# Patient Record
Sex: Male | Born: 1980 | Race: Black or African American | Hispanic: No | Marital: Married | Smoking: Former smoker
Health system: Southern US, Community
[De-identification: ages and names within clinical notes are randomized; demographics above are authoritative.]

## PROBLEM LIST (undated history)

## (undated) DIAGNOSIS — Q232 Congenital mitral stenosis: Secondary | ICD-10-CM

## (undated) DIAGNOSIS — D649 Anemia, unspecified: Secondary | ICD-10-CM

## (undated) DIAGNOSIS — I10 Essential (primary) hypertension: Secondary | ICD-10-CM

## (undated) DIAGNOSIS — G709 Myoneural disorder, unspecified: Secondary | ICD-10-CM

## (undated) DIAGNOSIS — T7840XA Allergy, unspecified, initial encounter: Secondary | ICD-10-CM

## (undated) DIAGNOSIS — R569 Unspecified convulsions: Secondary | ICD-10-CM

## (undated) DIAGNOSIS — G35 Multiple sclerosis: Secondary | ICD-10-CM

## (undated) HISTORY — DX: Congenital mitral stenosis: Q23.2

## (undated) HISTORY — DX: Allergy, unspecified, initial encounter: T78.40XA

## (undated) HISTORY — DX: Myoneural disorder, unspecified: G70.9

## (undated) HISTORY — DX: Essential (primary) hypertension: I10

## (undated) HISTORY — PX: FRACTURE SURGERY: SHX138

## (undated) HISTORY — DX: Unspecified convulsions: R56.9

## (undated) HISTORY — DX: Multiple sclerosis: G35

## (undated) HISTORY — DX: Anemia, unspecified: D64.9

---

## 2003-07-04 ENCOUNTER — Encounter: Admission: RE | Admit: 2003-07-04 | Discharge: 2003-07-04 | Payer: Self-pay | Admitting: Occupational Medicine

## 2012-12-19 ENCOUNTER — Ambulatory Visit (INDEPENDENT_AMBULATORY_CARE_PROVIDER_SITE_OTHER): Payer: BC Managed Care – PPO | Admitting: Family Medicine

## 2012-12-19 VITALS — BP 118/70 | HR 46 | Temp 98.3°F | Resp 16 | Ht 73.25 in | Wt 223.8 lb

## 2012-12-19 DIAGNOSIS — R21 Rash and other nonspecific skin eruption: Secondary | ICD-10-CM

## 2012-12-19 MED ORDER — CETIRIZINE HCL 10 MG PO TABS: 10.0000 mg | ORAL_TABLET | Freq: Every day | ORAL | Status: DC

## 2012-12-19 MED ORDER — TRIAMCINOLONE ACETONIDE 0.1 % EX CREA: TOPICAL_CREAM | Freq: Two times a day (BID) | CUTANEOUS | Status: DC

## 2012-12-19 MED ORDER — RANITIDINE HCL 150 MG PO TABS: 150.0000 mg | ORAL_TABLET | Freq: Two times a day (BID) | ORAL | Status: DC

## 2012-12-19 MED ORDER — HYDROXYZINE HCL 25 MG PO TABS: 12.5000 mg | ORAL_TABLET | Freq: Three times a day (TID) | ORAL | Status: DC | PRN

## 2012-12-19 NOTE — Progress Notes (Signed)
  Subjective:    Patient ID: Dylan Mccoy, male    DOB: [birth year], 32 y.o.   MRN: [id number] Chief Complaint  Patient presents with  . Urticaria    x 3 weeks, worsening even with Benedryl and Allegra use.    HPI  Dylan Mccoy is a pleasant 32 yo male who developed a lot of very itchy bumps around his axilla (but not in the actual axilla/hair area). Has had similar rashes in past but always goes away on its own within a few d.  Now spreading down arms to hands and up onto neck.  Very itchy.  Spreading and none fading going away at all, constant.  Trying allegra and benadryl as well as topical benadryl w/ only temporary relief.  No known allergies or triggers.  No change in products - detergent, deodorant, soap, etc.  No contacts w/ similar rash. No animals.  Has an appt sched w/ derm but not for 2 more weeks and itching so bad he couldn't wait that long.  History reviewed. No pertinent past medical history. No current outpatient prescriptions on file prior to visit.   No current facility-administered medications on file prior to visit.   No Known Allergies  Review of Systems  Constitutional: Negative for fever, chills, diaphoresis, activity change, appetite change, fatigue and unexpected weight change.  HENT: Negative for congestion, sore throat, rhinorrhea and sneezing.   Respiratory: Negative for cough.   Cardiovascular: Negative for leg swelling.  Gastrointestinal: Negative for nausea, vomiting, abdominal pain, diarrhea and constipation.  Musculoskeletal: Negative for myalgias, joint swelling and gait problem.  Skin: Positive for rash. Negative for color change, pallor and wound.  Neurological: Negative for weakness and numbness.  Hematological: Negative for adenopathy. Does not bruise/bleed easily.      BP 118/70  Pulse 46  Temp(Src) 98.3 F (36.8 C) (Oral)  Resp 16  Ht 6' 1.25" (1.861 m)  Wt 223 lb 12.8 oz (101.515 kg)  BMI 29.31 kg/m2  SpO2 99% Objective:   Physical  Exam  Constitutional: He is oriented to person, place, and time. He appears well-developed and well-nourished. No distress.  HENT:  Head: Normocephalic and atraumatic.  Eyes: No scleral icterus.  Pulmonary/Chest: Effort normal.  Neurological: He is alert and oriented to person, place, and time.  Skin: Skin is warm and dry. Rash noted. Rash is papular. He is not diaphoretic.  Small skin-colored pinpoint papules - clustered together till almost confluent along posterior and anterior aspects of axilla (none where hair or deodorant is) and extending down arms to hands and fingers, no lesions on palms or between fingers. Most papules excoriated, no sign of any vesicles or pustules. Papules decrease dramatically in freq as they descend till just 1 or 2 on each volar surface of fingers.  None over trunk, face, neck, or legs.  Psychiatric: He has a normal mood and affect. His behavior is normal.      Assessment & Plan:  Rash - unknown etiology -  ddx includes dermatitis herpetiformis, contact dermatitis, lichen planus.  Try topical steroid, cetirizine w/ prn atarax, ranitidine. Refer to derm. If he does not get sig relief w/ treatment, return for further eval and possible biopsy.

## 2013-03-19 DIAGNOSIS — L509 Urticaria, unspecified: Secondary | ICD-10-CM | POA: Insufficient documentation

## 2016-11-29 ENCOUNTER — Ambulatory Visit: Payer: Self-pay | Admitting: Allergy and Immunology

## 2016-12-26 ENCOUNTER — Encounter: Payer: Self-pay | Admitting: Allergy and Immunology

## 2016-12-26 ENCOUNTER — Ambulatory Visit (INDEPENDENT_AMBULATORY_CARE_PROVIDER_SITE_OTHER): Payer: BLUE CROSS/BLUE SHIELD | Admitting: Allergy and Immunology

## 2016-12-26 DIAGNOSIS — L309 Dermatitis, unspecified: Secondary | ICD-10-CM

## 2016-12-26 MED ORDER — CLOBETASOL PROPIONATE 0.05 % EX FOAM
Freq: Two times a day (BID) | CUTANEOUS | 0 refills | Status: DC
Start: 2016-12-26 — End: 2017-06-19

## 2016-12-26 MED ORDER — LEVOCETIRIZINE DIHYDROCHLORIDE 5 MG PO TABS: 5.0000 mg | ORAL_TABLET | Freq: Every evening | ORAL | 5 refills | Status: DC

## 2016-12-26 NOTE — Progress Notes (Signed)
New Patient Note  RE: Dylan Mccoy MRN: [id number] DOB: May 28, 1981 Date of Office Visit: 12/26/2016  Referring provider: No ref. provider found Primary care provider: No PCP Per Patient  Chief Complaint: Rash   History of present illness: Dylan Mccoy is a 36 y.o. male presenting today for evaluation of dermatitis.  He reports that over the past 3 or 4 years he has experienced dermatitis, primarily on his hands, but also on his chest, axillae, and neck.  The rash is comprised of small vesicles which are mildly pruritic.  He was evaluated by another allergist previously with patch testing which revealed positive reactions and nickel as well as a few other substances which he cannot recall. He states that at the time he had attempted to eliminate these substances from his environment, however was unable to do this completely as he works in a [employer].  He has experienced some symptom relief with cetirizine, however the rash persists despite taking this medication because worse when he stops taking this medication.  Triamcinolone ointment has provided modest relief.  He has no symptoms consistent with allergic rhinitis, allergic conjunctivitis, or asthma.  He has never been evaluated by dermatologist or had a skin biopsy, however he is scheduled for dermatology consultation on Jan 11, 2017.   Assessment and plan: Dermatitis  Physical examination and history of positive patch testing support allergic contact dermatitis.  A prescription has been provided for levocetirizine, 5mg  daily as needed.  A prescription has been provided for clobetasol 0.05% foam sparingly to affected areas twice daily as needed.  This medication is not to be used on the face, neck, axillae, or groin.  We will retrieve patch test results from his previous allergist.  If he is unable to successfully avoid environmental substances contributing to the dermatitis, he may try barrier cream (ie, Gloves in a  Bottle or Liquid Gloves).  Keep appointment with dermatologist as a biopsy may be beneficial in confirming the diagnosis.   Meds ordered this encounter  Medications  . levocetirizine (XYZAL) 5 MG tablet    Sig: Take 1 tablet (5 mg total) by mouth every evening.    Dispense:  30 tablet    Refill:  5  . clobetasol (OLUX) 0.05 % topical foam    Sig: Apply topically 2 (two) times daily.    Dispense:  50 g    Refill:  0    Diagnostics: None. We will retrieve previous patch test results.    Physical examination: Blood pressure 110/70, pulse 60, temperature 97.6 F (36.4 C), temperature source Oral, resp. rate 16, height 6\' 2"  (1.88 m), weight 239 lb 9.6 oz (108.7 kg), SpO2 97 %.  General: Alert, interactive, in no acute distress. Neck: Supple without lymphadenopathy. Lungs: Clear to auscultation without wheezing, rhonchi or rales. CV: Normal S1, S2 without murmurs. Abdomen: Nondistended, nontender. Skin: Scattered and clustered vesicles on the hands and fingers, particularly between her fingers. Extremities:  No clubbing, cyanosis or edema. Neuro:   Grossly intact.  Review of systems:  Review of systems negative except as noted in HPI / PMHx or noted below: Review of Systems  Constitutional: Negative.   HENT: Negative.   Eyes: Negative.   Respiratory: Negative.   Cardiovascular: Negative.   Gastrointestinal: Negative.   Genitourinary: Negative.   Musculoskeletal: Negative.   Skin: Negative.   Neurological: Negative.   Endo/Heme/Allergies: Negative.   Psychiatric/Behavioral: Negative.     Past medical history:  Dermatitis.  Past surgical history:  Past Surgical History:  Procedure Laterality Date  . FRACTURE SURGERY     R elbow, 2nd grade    Family history: Family History  Problem Relation Age of Onset  . Asthma Mother   . Allergies Mother   . Urticaria Mother   . Asthma Brother   . Allergies Brother   . Diabetes Maternal Grandmother   . Diabetes  Paternal Grandmother   . Allergic rhinitis Neg Hx   . Angioedema Neg Hx   . Atopy Neg Hx   . Eczema Neg Hx   . Immunodeficiency Neg Hx     Social history: Social History   Social History  . Marital status: Single    Spouse name: N/A  . Number of children: N/A  . Years of education: N/A   Occupational History  . Not on file.   Social History Main Topics  . Smoking status: Former Games developer  . Smokeless tobacco: Never Used  . Alcohol use No  . Drug use: No  . Sexual activity: Yes    Birth control/ protection: Pill     Comment: 1 partner last year   Other Topics Concern  . Not on file   Social History Narrative  . No narrative on file   Environmental History: The patient lives in a 36 year old townhouse with carpeting throughout, gas heat, and central air.  There is a dog in the townhouse.  There is no known mold/water damage in the townhome.  He is a nonsmoker.  Allergies as of 12/26/2016   No Known Allergies     Medication List       Accurate as of 12/26/16  5:15 PM. Always use your most recent med list.          cetirizine 10 MG tablet Commonly known as:  ZYRTEC Take 1 tablet (10 mg total) by mouth daily.   clobetasol 0.05 % topical foam Commonly known as:  OLUX Apply topically 2 (two) times daily.   hydrOXYzine 25 MG tablet Commonly known as:  ATARAX/VISTARIL Take 0.5-1 tablets (12.5-25 mg total) by mouth every 8 (eight) hours as needed for itching.   levocetirizine 5 MG tablet Commonly known as:  XYZAL Take 1 tablet (5 mg total) by mouth every evening.   ranitidine 150 MG tablet Commonly known as:  ZANTAC Take 1 tablet (150 mg total) by mouth 2 (two) times daily.   triamcinolone cream 0.1 % Commonly known as:  KENALOG Apply topically 2 (two) times daily.       Known medication allergies: No Known Allergies  I appreciate the opportunity to take part in Stephone's care. Please do not hesitate to contact me with questions.  Sincerely,   R.  Jorene Guest, MD

## 2016-12-26 NOTE — Assessment & Plan Note (Addendum)
   Physical examination and history of positive patch testing support allergic contact dermatitis.  A prescription has been provided for levocetirizine, 5mg  daily as needed.  A prescription has been provided for clobetasol 0.05% foam sparingly to affected areas twice daily as needed.  This medication is not to be used on the face, neck, axillae, or groin.  We will retrieve patch test results from his previous allergist.  If he is unable to successfully avoid environmental substances contributing to the dermatitis, he may try barrier cream (ie, Gloves in a Bottle or Liquid Gloves).  Keep appointment with dermatologist as a biopsy may be beneficial in confirming the diagnosis.

## 2016-12-26 NOTE — Patient Instructions (Addendum)
Dermatitis  Physical examination and history of positive patch testing support allergic contact dermatitis.  A prescription has been provided for levocetirizine, 5mg  daily as needed.  A prescription has been provided for clobetasol 0.05% foam sparingly to affected areas twice daily as needed.  This medication is not to be used on the face, neck, axillae, or groin.  We will retrieve patch test results from his previous allergist.  If he is unable to successfully avoid environmental substances contributing to the dermatitis, he may try barrier cream (ie, Gloves in a Bottle or Liquid Gloves).  Keep appointment with dermatologist as a biopsy may be beneficial in confirming the diagnosis.   Return if symptoms worsen or fail to improve.

## 2016-12-27 ENCOUNTER — Telehealth: Payer: Self-pay | Admitting: *Deleted

## 2016-12-27 NOTE — Telephone Encounter (Signed)
-----   Message from Cristal Ford, MD sent at 12/26/2016  5:13 PM EDT ----- Please inform the patient that his insurance does not cover mometasone ointment I planned on prescribing, therefore I have prescribed clobetasol 0.05% foam.  This is actually a better medication and I am surprised that his insurance covers it.  He may use it one or 2 times daily as needed.  This medication is not to be used on the face, neck, axillae, or groin.

## 2016-12-27 NOTE — Telephone Encounter (Signed)
Left message to return call 

## 2016-12-28 NOTE — Telephone Encounter (Signed)
Spoke to patient advised as written below verbalized undestanding

## 2017-02-15 ENCOUNTER — Emergency Department (HOSPITAL_COMMUNITY): Payer: BLUE CROSS/BLUE SHIELD

## 2017-02-15 ENCOUNTER — Other Ambulatory Visit: Payer: Self-pay

## 2017-02-15 ENCOUNTER — Inpatient Hospital Stay (HOSPITAL_COMMUNITY)
Admission: EM | Admit: 2017-02-15 | Discharge: 2017-02-19 | DRG: 060 | Disposition: A | Discharge status: Home / Self Care | Payer: BLUE CROSS/BLUE SHIELD | Attending: Family Medicine | Admitting: Family Medicine

## 2017-02-15 ENCOUNTER — Other Ambulatory Visit (HOSPITAL_COMMUNITY): Payer: Self-pay

## 2017-02-15 ENCOUNTER — Encounter (HOSPITAL_COMMUNITY): Payer: Self-pay | Admitting: Emergency Medicine

## 2017-02-15 ENCOUNTER — Observation Stay (HOSPITAL_COMMUNITY): Payer: BLUE CROSS/BLUE SHIELD

## 2017-02-15 DIAGNOSIS — R4781 Slurred speech: Secondary | ICD-10-CM | POA: Diagnosis not present

## 2017-02-15 DIAGNOSIS — Z87891 Personal history of nicotine dependence: Secondary | ICD-10-CM

## 2017-02-15 DIAGNOSIS — G379 Demyelinating disease of central nervous system, unspecified: Secondary | ICD-10-CM

## 2017-02-15 DIAGNOSIS — Z91048 Other nonmedicinal substance allergy status: Secondary | ICD-10-CM

## 2017-02-15 DIAGNOSIS — Z889 Allergy status to unspecified drugs, medicaments and biological substances status: Secondary | ICD-10-CM | POA: Diagnosis not present

## 2017-02-15 DIAGNOSIS — Z91018 Allergy to other foods: Secondary | ICD-10-CM

## 2017-02-15 DIAGNOSIS — Z79899 Other long term (current) drug therapy: Secondary | ICD-10-CM

## 2017-02-15 DIAGNOSIS — E559 Vitamin D deficiency, unspecified: Secondary | ICD-10-CM | POA: Diagnosis present

## 2017-02-15 DIAGNOSIS — G35 Multiple sclerosis: Secondary | ICD-10-CM | POA: Diagnosis not present

## 2017-02-15 LAB — COMPREHENSIVE METABOLIC PANEL
ALT: 49 U/L (ref 17–63)
AST: 27 U/L (ref 15–41)
Albumin: 4.7 g/dL (ref 3.5–5.0)
Alkaline Phosphatase: 48 U/L (ref 38–126)
Anion gap: 10 (ref 5–15)
BUN: 10 mg/dL (ref 6–20)
CO2: 24 mmol/L (ref 22–32)
Calcium: 9.4 mg/dL (ref 8.9–10.3)
Chloride: 104 mmol/L (ref 101–111)
Creatinine, Ser: 1.15 mg/dL (ref 0.61–1.24)
GFR calc Af Amer: >60 mL/min (ref >60)
GFR calc non Af Amer: >60 mL/min (ref >60)
Glucose, Bld: 100 mg/dL — ABNORMAL HIGH (ref 65–99)
Potassium: 4 mmol/L (ref 3.5–5.1)
Sodium: 138 mmol/L (ref 135–145)
Total Bilirubin: 0.8 mg/dL (ref 0.3–1.2)
Total Protein: 8.2 g/dL — ABNORMAL HIGH (ref 6.5–8.1)

## 2017-02-15 LAB — DIFFERENTIAL
Basophils Absolute: 0 10*3/uL (ref 0.0–0.1)
Basophils Relative: 0 %
Eosinophils Absolute: 0.2 10*3/uL (ref 0.0–0.7)
Eosinophils Relative: 4 %
Lymphocytes Relative: 46 %
Lymphs Abs: 2.5 10*3/uL (ref 0.7–4.0)
Monocytes Absolute: 0.3 10*3/uL (ref 0.1–1.0)
Monocytes Relative: 6 %
Neutro Abs: 2.4 10*3/uL (ref 1.7–7.7)
Neutrophils Relative %: 44 %

## 2017-02-15 LAB — CBC
HCT: 44 % (ref 39.0–52.0)
Hemoglobin: 14 g/dL (ref 13.0–17.0)
MCH: 24.3 pg — ABNORMAL LOW (ref 26.0–34.0)
MCHC: 31.8 g/dL (ref 30.0–36.0)
MCV: 76.5 fL — ABNORMAL LOW (ref 78.0–100.0)
Platelets: 157 10*3/uL (ref 150–400)
RBC: 5.75 MIL/uL (ref 4.22–5.81)
RDW: 13.5 % (ref 11.5–15.5)
WBC: 5.4 10*3/uL (ref 4.0–10.5)

## 2017-02-15 LAB — PROTIME-INR
INR: 1.07
Prothrombin Time: 13.9 seconds (ref 11.4–15.2)

## 2017-02-15 LAB — APTT: aPTT: 30 seconds (ref 24–36)

## 2017-02-15 LAB — MRSA PCR SCREENING: MRSA by PCR: NEGATIVE

## 2017-02-15 LAB — I-STAT TROPONIN, ED: Troponin i, poc: 0 ng/mL (ref 0.00–0.08)

## 2017-02-15 MED ORDER — STROKE: EARLY STAGES OF RECOVERY BOOK
Freq: Once | Status: AC
  Administered 2017-02-15: 21:00:00
  Filled 2017-02-15: qty 1

## 2017-02-15 MED ORDER — SODIUM CHLORIDE 0.9 % IV SOLN
1000.0000 mg | Freq: Every day | INTRAVENOUS | Status: AC
  Administered 2017-02-16 – 2017-02-19 (×5): 1000 mg via INTRAVENOUS
  Filled 2017-02-15 (×6): qty 8

## 2017-02-15 MED ORDER — ACETAMINOPHEN 325 MG PO TABS: 650.0000 mg | ORAL_TABLET | Freq: Four times a day (QID) | ORAL | Status: DC | PRN

## 2017-02-15 MED ORDER — GADOBENATE DIMEGLUMINE 529 MG/ML IV SOLN
20.0000 mL | Freq: Once | INTRAVENOUS | Status: AC
  Administered 2017-02-15: 20 mL via INTRAVENOUS

## 2017-02-15 MED ORDER — ASPIRIN 81 MG PO CHEW
324.0000 mg | CHEWABLE_TABLET | Freq: Once | ORAL | Status: AC
  Administered 2017-02-15: 324 mg via ORAL
  Filled 2017-02-15: qty 4

## 2017-02-15 MED ORDER — DIPHENHYDRAMINE HCL 25 MG PO CAPS: 25.0000 mg | ORAL_CAPSULE | Freq: Three times a day (TID) | ORAL | Status: DC | PRN

## 2017-02-15 MED ORDER — FAMOTIDINE 20 MG PO TABS
20.0000 mg | ORAL_TABLET | Freq: Every day | ORAL | Status: DC
  Administered 2017-02-16 – 2017-02-19 (×4): 20 mg via ORAL
  Filled 2017-02-15 (×4): qty 1

## 2017-02-15 MED ORDER — ENOXAPARIN SODIUM 40 MG/0.4ML ~~LOC~~ SOLN
40.0000 mg | SUBCUTANEOUS | Status: DC
  Administered 2017-02-15 – 2017-02-18 (×4): 40 mg via SUBCUTANEOUS
  Filled 2017-02-15 (×4): qty 0.4

## 2017-02-15 MED ORDER — LORATADINE 10 MG PO TABS
10.0000 mg | ORAL_TABLET | Freq: Every day | ORAL | Status: DC
  Administered 2017-02-16 – 2017-02-19 (×4): 10 mg via ORAL
  Filled 2017-02-15 (×4): qty 1

## 2017-02-15 MED ORDER — POLYETHYLENE GLYCOL 3350 17 G PO PACK: 17.0000 g | PACK | Freq: Every day | ORAL | Status: DC | PRN

## 2017-02-15 MED ORDER — ACETAMINOPHEN 650 MG RE SUPP: 650.0000 mg | Freq: Four times a day (QID) | RECTAL | Status: DC | PRN

## 2017-02-15 NOTE — ED Notes (Signed)
Patient transported to MRI 

## 2017-02-15 NOTE — ED Provider Notes (Signed)
[facility]-EMERGENCY DEPT Provider Note   CSN: [id number] Arrival date & time: 02/15/17  1425     History   Chief Complaint Chief Complaint  Patient presents with  . slurred speech  . hand tingling    HPI Dylan Mccoy is a 36 y.o. male.  HPI   Patient is a healthy 36 year old male presenting with strokelike symptoms. Patient's this began on Saturday when he was in [place] with his wife for their anniversary. Patient noticed that he woke up on Saturday morning and was drooling out of the right side of his face. He had trouble closing his mouth when brushing his teeth. Patient also noticed numbness tingling and weakness to the right hand.  These symptoms have been residual and he came here to the emergency room today for evaluation.  No recent illness. No past medical history including a cardiac history, no hypertension hyperlipidemia and no diabetes.    History reviewed. No pertinent past medical history.  Patient Active Problem List   Diagnosis Date Noted  . Dermatitis 12/26/2016  . Hives 03/19/2013    Past Surgical History:  Procedure Laterality Date  . FRACTURE SURGERY     R elbow, 2nd grade       Home Medications    Prior to Admission medications   Medication Sig Start Date End Date Taking? Authorizing Provider  cetirizine (ZYRTEC) 10 MG tablet Take 1 tablet (10 mg total) by mouth daily. 12/19/12   Sherren Mocha, MD  clobetasol (OLUX) 0.05 % topical foam Apply topically 2 (two) times daily. 12/26/16   Bobbitt, Heywood Iles, MD  hydrOXYzine (ATARAX/VISTARIL) 25 MG tablet Take 0.5-1 tablets (12.5-25 mg total) by mouth every 8 (eight) hours as needed for itching. Patient not taking: Reported on 12/26/2016 12/19/12   Sherren Mocha, MD  levocetirizine (XYZAL) 5 MG tablet Take 1 tablet (5 mg total) by mouth every evening. 12/26/16   Bobbitt, Heywood Iles, MD  ranitidine (ZANTAC) 150 MG tablet Take 1 tablet (150 mg total) by mouth 2 (two) times daily. 12/19/12   Sherren Mocha, MD   triamcinolone cream (KENALOG) 0.1 % Apply topically 2 (two) times daily. 12/19/12   Sherren Mocha, MD    Family History Family History  Problem Relation Age of Onset  . Asthma Mother   . Allergies Mother   . Urticaria Mother   . Asthma Brother   . Allergies Brother   . Diabetes Maternal Grandmother   . Diabetes Paternal Grandmother   . Allergic rhinitis Neg Hx   . Angioedema Neg Hx   . Atopy Neg Hx   . Eczema Neg Hx   . Immunodeficiency Neg Hx     Social History Social History  Substance Use Topics  . Smoking status: Former Games developer  . Smokeless tobacco: Never Used  . Alcohol use No     Allergies   Nickel and Other   Review of Systems Review of Systems  Constitutional: Negative for activity change.  Respiratory: Negative for shortness of breath.   Cardiovascular: Negative for chest pain.  Gastrointestinal: Negative for abdominal pain.  Neurological: Positive for facial asymmetry, weakness and numbness.     Physical Exam Updated Vital Signs BP (!) 146/77   Pulse 62   Temp 98.2 F (36.8 C)   Resp 19   SpO2 99%   Physical Exam  Constitutional: He is oriented to person, place, and time. He appears well-nourished.  HENT:  Head: Normocephalic and atraumatic.  Eyes: Conjunctivae are normal.  Cardiovascular: Normal rate.   Pulmonary/Chest: Effort normal.  Neurological: He is oriented to person, place, and time.  Slurred speech. Mild facial droop right. Equal strength bilateral upper extremity. Patient does note numbness to the thumb and mixing her on the right hand.  Skin: Skin is warm and dry. He is not diaphoretic.  Psychiatric: He has a normal mood and affect. His behavior is normal.     ED Treatments / Results  Labs (all labs ordered are listed, but only abnormal results are displayed) Labs Reviewed  CBC - Abnormal; Notable for the following:       Result Value   MCV 76.5 (*)    MCH 24.3 (*)    All other components within normal limits    COMPREHENSIVE METABOLIC PANEL - Abnormal; Notable for the following:    Glucose, Bld 100 (*)    Total Protein 8.2 (*)    All other components within normal limits  PROTIME-INR  APTT  DIFFERENTIAL  I-STAT TROPOININ, ED    EKG  EKG Interpretation None       Radiology Ct Head Wo Contrast  Result Date: 02/15/2017 CLINICAL DATA:  Slurred speech EXAM: CT HEAD WITHOUT CONTRAST TECHNIQUE: Contiguous axial images were obtained from the base of the skull through the vertex without intravenous contrast. COMPARISON:  None. FINDINGS: Brain: Subtle area of low-density noted in the deep white matter in the left frontoparietal region. No hemorrhage. No hydrocephalus. No mass lesion. Vascular: No hyperdense vessel or unexpected calcification. Skull: No acute calvarial abnormality. Sinuses/Orbits: Visualized paranasal sinuses and mastoids clear. Orbital soft tissues unremarkable. Other: None IMPRESSION: Subtle low-density in the deep white matter in the left frontoparietal region. Cannot exclude acute infarct. May consider further evaluation with MRI. Electronically Signed   By: Charlett Nose M.D.   On: 02/15/2017 16:00    Procedures Procedures (including critical care time)  Medications Ordered in ED Medications  aspirin chewable tablet 324 mg (324 mg Oral Given 02/15/17 1742)     Initial Impression / Assessment and Plan / ED Course  I have reviewed the triage vital signs and the nursing notes.  Pertinent labs & imaging results that were available during my care of the patient were reviewed by me and considered in my medical decision making (see chart for details).    Patient is a healthy 36 year old male presenting with strokelike symptoms. Patient's this began on Saturday when he was in [place] with his wife for their anniversary. Patient noticed that he woke up on Saturday morning and was drooling out of the right side of his face. He had trouble closing his mouth when brushing his teeth.  Patient also noticed numbness tingling and weakness to the right hand.  These symptoms have been residual and he came here to the emergency room today for evaluation.  5:49 PM CT shows questionable infarct. Given his exam I do not believe this represents Bell's palsy, this looks like a stroke. It is obvious patient has no risk factors and is very young. Patient denies any cocaine use.  We'll discuss with neurology.  5:49 PM Stroke vs MS. Will order MRI and admit to medicine.   Final Clinical Impressions(s) / ED Diagnoses   Final diagnoses:  None    New Prescriptions New Prescriptions   No medications on file     Abelino Derrick, MD 02/15/17 2335

## 2017-02-15 NOTE — ED Notes (Signed)
ED Provider at bedside. 

## 2017-02-15 NOTE — Consult Note (Signed)
NEURO HOSPITALIST CONSULT NOTE   Requestig physician: Dr. Nori Riis  Reason for Consult: Right face and hand sensory numbness  History obtained from:  Patient and Chart    HPI:                                                                                                                                          Dylan Mccoy is an 36 y.o. male who presented to the ED on Wednesday with right face and hand sensory numbness in conjunction with slurred speech. His symptoms began on Saturday while he was visiting [place] with his wife for their anniversary, first noticed on Saturday morning when he woke up and was drooling out of the right side of his mouth with associated right perioral numbness. When brushing his teeth, he continued to drool. He then noticed numbness and tingling of his right hand in conjunction with mild right hand weakness. The symptoms persisted during his entire visit and still had not resolved upon returning home, prompting the patient to be evaluated. He denies associated confusion, vision changes, trouble understanding speech, headache, neck pain, eye pain, chest pain, abdominal pain or limb pain. He is in good health and does not take any medications. He has no prior history of neurological symptoms.  History reviewed. No pertinent past medical history.  Past Surgical History:  Procedure Laterality Date  . FRACTURE SURGERY     R elbow, 2nd grade    Family History  Problem Relation Age of Onset  . Asthma Mother   . Allergies Mother   . Urticaria Mother   . Asthma Brother   . Allergies Brother   . Diabetes Maternal Grandmother   . Diabetes Paternal Grandmother   . Allergic rhinitis Neg Hx   . Angioedema Neg Hx   . Atopy Neg Hx   . Eczema Neg Hx   . Immunodeficiency Neg Hx     Social History:  reports that he has quit smoking. He has never used smokeless tobacco. He reports that he does not drink alcohol or use drugs.  Allergies   Allergen Reactions  . Nickel   . Other     Fragrances    MEDICATIONS:  Not on any home medications.  ROS:                                                                                                                                       As per HPI.   Blood pressure (!) 155/57, pulse (!) 50, temperature 98.2 F (36.8 C), temperature source Oral, resp. rate 20, height 6' 2"  (1.88 m), weight 108 kg (238 lb), SpO2 100 %.  General Examination:                                                                                                      HEENT-  [place]/AT Lungs: Respirations unlabored. No gross wheezing.  Abdomen- Nondistended Extremities- No edema  Neurological Examination Mental Status: Alert, oriented, thought content appropriate.  In the context of mild dysarthria, speech is fluent without evidence of aphasia.  Able to follow all commands without difficulty. Cranial Nerves: II: Visual fields intact bilaterally. PERRL  III,IV, VI: ptosis not present, EOMI without nystagmus V,VII: smile symmetric, facial temp sensation normal bilaterally VIII: hearing intact to conversation IX,X: palate rises symmetrically XI: symmetric XII: midline tongue extension Motor:  Right : Upper extremity   5/5    Left:     Upper extremity   5/5  Lower extremity   5/5     Lower extremity   5/5 Normal tone throughout; no atrophy noted Sensory: Temperature and light touch sensation intact x 4. No extinction.  Deep Tendon Reflexes: 2+ and symmetric throughout Plantars: Right: downgoing   Left: downgoing Cerebellar: No ataxia with FNF bilaterally.  Gait: Deferred   Lab Results: Basic Metabolic Panel:  Recent Labs Lab 02/15/17 1449  NA 138  K 4.0  CL 104  CO2 24  GLUCOSE 100*  BUN 10  CREATININE 1.15  CALCIUM 9.4    Liver Function Tests:  Recent Labs Lab 02/15/17 1449   AST 27  ALT 49  ALKPHOS 48  BILITOT 0.8  PROT 8.2*  ALBUMIN 4.7   No results for input(s): LIPASE, AMYLASE in the last 168 hours. No results for input(s): AMMONIA in the last 168 hours.  CBC:  Recent Labs Lab 02/15/17 1449  WBC 5.4  NEUTROABS 2.4  HGB 14.0  HCT 44.0  MCV 76.5*  PLT 157    Cardiac Enzymes: No results for input(s): CKTOTAL, CKMB, CKMBINDEX, TROPONINI in the last 168 hours.  Lipid Panel: No results for input(s): CHOL, TRIG, HDL, CHOLHDL, VLDL, LDLCALC in the last 168 hours.  CBG: No results for input(s): GLUCAP in the last 168 hours.  Microbiology: Results for orders placed or performed during the hospital encounter of 02/15/17  MRSA PCR Screening     Status: None   Collection Time: 02/15/17  8:53 PM  Result Value Ref Range Status   MRSA by PCR NEGATIVE NEGATIVE Final    Comment:        The GeneXpert MRSA Assay (FDA approved for NASAL specimens only), is one component of a comprehensive MRSA colonization surveillance program. It is not intended to diagnose MRSA infection nor to guide or monitor treatment for MRSA infections.     Coagulation Studies:  Recent Labs  02/15/17 1449  LABPROT 13.9  INR 1.07    Imaging: Ct Head Wo Contrast  Result Date: 02/15/2017 CLINICAL DATA:  Slurred speech EXAM: CT HEAD WITHOUT CONTRAST TECHNIQUE: Contiguous axial images were obtained from the base of the skull through the vertex without intravenous contrast. COMPARISON:  None. FINDINGS: Brain: Subtle area of low-density noted in the deep white matter in the left frontoparietal region. No hemorrhage. No hydrocephalus. No mass lesion. Vascular: No hyperdense vessel or unexpected calcification. Skull: No acute calvarial abnormality. Sinuses/Orbits: Visualized paranasal sinuses and mastoids clear. Orbital soft tissues unremarkable. Other: None IMPRESSION: Subtle low-density in the deep white matter in the left frontoparietal region. Cannot exclude acute  infarct. May consider further evaluation with MRI. Electronically Signed   By: Rolm Baptise M.D.   On: 02/15/2017 16:00   Mr Jeri Cos And Wo Contrast  Result Date: 02/15/2017 CLINICAL DATA:  RIGHT facial weakness, RIGHT hand numbness beginning 4 days ago while on vacation. EXAM: MRI HEAD WITHOUT AND WITH CONTRAST TECHNIQUE: Multiplanar, multiecho pulse sequences of the brain and surrounding structures were obtained without and with intravenous contrast. CONTRAST:  20m MULTIHANCE GADOBENATE DIMEGLUMINE 529 MG/ML IV SOLN COMPARISON:  CT HEAD February 15, 2017 at 1556 hours FINDINGS: INTRACRANIAL CONTENTS: LEFT centrum semiovale 23 x 18 mm FLAIR T2 hyperintense lesion with marginal reduced diffusion, central T2 shine through and discontinuous marginal enhancement. Faint reduced diffusion and enhancement RIGHT cerebral peduncle 6 mm lesion. Small juxta cortical LEFT parietal lesion. At least 6 additional supratentorial white matter lesions, many of which radiate from the periventricular margin with low T1 signal compatible with black holes of demyelination. Prominent perivascular spaces. Ventricles and sulci normal for patient's age, cavum septum pellucidum. No midline shift or mass effect. No abnormal extra-axial fluid collections or abnormal extra-axial enhancement. VASCULAR: Normal major intracranial vascular flow voids present at skull base. SKULL AND UPPER CERVICAL SPINE: No abnormal sellar expansion. No suspicious calvarial bone marrow signal. Craniocervical junction maintained. SINUSES/ORBITS: The mastoid air-cells and included paranasal sinuses are well-aerated.The included ocular globes and orbital contents are non-suspicious. OTHER: None. IMPRESSION: Multiple supratentorial lesions compatible with acute on chronic demyelination including enhancing 23 x 18 mm LEFT centrum semiovale tumefactive lesion and, subcentimeter RIGHT cerebral peduncle lesion. No parenchymal brain volume loss for age. Acute findings  discussed with and reconfirmed by Dr.COURTENEY MACKUEN on 02/15/2017 at 8:30 pm. Electronically Signed   By: CElon AlasM.D.   On: 02/15/2017 20:50    Assessment: 36year old male with right perioral and hand sensory and motor symptoms 1. Neurological exam reveals mild dysarthria and is otherwise normal.  2. MRI brain reveals supratentorial and infratentorial T2-weighted lesions exhibiting a distribution and morphologies which are most consistent with multiple sclerosis. Two of the lesions, one in the periventricular white matter and one within the midbrain on  the right, exhibit contrast enhancement, consistent with acute demyelination.  3. 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    Recommendations: 1. MRI of cervical spine with and without contrast to rule out cervical spinal cord lesion. 2. Solumedrol 1000 mg IV qd x 5 days.  3. Serum ANA, lyme titer, ESR, C-reactive protein.  4. Outpatient follow up with [facility].  5. An LP can be obtained as an outpatient to assess for oligoclonal bands and IgG index.  6. Will need to have a discussion with his outpatient Neurologist regarding risks/benefits of immunomodulatory therapy in the setting of CIS.  7. Education provided to the patient and his wife regarding the above. I have also reviewed the images from his brain MRI with the patient and his wife.  8. Vitamin D level. May need supplementation, with final decision regarding such to be made during his outpatient Neurology clinic visit.   Electronically signed: Dr. Kerney Elbe 02/15/2017, 11:58 PM

## 2017-02-15 NOTE — ED Triage Notes (Addendum)
PT reports on Saturday he began having slurred speech. He had hives around his mouth. Pt took zyrtec and benadryl. PT states he still has some hives. Pt states now he thinks it is more of a numbness to his tongue, still has slurred speech. Pt also states he noticed his right hand was tingling and slower than normal starting on Saturday.  Pt also reports right arm weakness since Saturday.

## 2017-02-15 NOTE — H&P (Signed)
Family Medicine Teaching [facility] Admission History and Physical Service Pager: [phone]  Patient name: Dylan Mccoy Medical record number: [id number] Date of birth: [birth year] Age: 36 y.o. Gender: male  Primary Care Provider: Patient, No Pcp Per Consultants: neuro Code Status: Full  Chief Complaint: slurred speech  Assessment and Plan: RYLAN BERNARD is a 36 y.o. male presenting with slurred speech. PMH is significant for metal and food allergies, eczema  Slurred speech/Numbness of right lower mouth- Concern for TIA/Stroke vs autoimmune. Persistent neurologic symptoms since Saturday morning, last normal Friday evening. Initially right mouth weakness somewhat resolved now with numbness and changes in speech per patient. CT in ED concerning for subtle density in deep white matter in left frontoparietal region. Cannot exclude acute infarct on this study. Concern for stroke although patient is young without risk factors. BP slightly elevated in ED, though no hx of HTN. CBC, CMP normal. Troponin 0. Differential includes MS, bell's palsy. -place in observation, attending Dr. Jennette Kettle -vitals per floor -neurology consulted in ED, appreciate recommendations -MRI brain pending -am CBC, CMP -bedside swallow study - Carotids pending  - Consider ECHO if MRI concerning for Stroke  -check Lipid panel, A1C, TSH -monitor BP closely   Allergies- on xyzal, claritin, topical kenolog, hydroxyzine prn at home -claritin on formulary and ordered qhs -benadryl q8 as needed for itching  FEN/GI: NPO pending bedside swallow screen then regular diet, pepcid Prophylaxis: lovenox  Disposition: pending clinical improvement  History of Present Illness:  Dylan Mccoy is a 36 y.o. male presenting with slurred speech since Saturday.  Was in [place] for anniversary. Friday night he felt normal, woke up Saturday morning and noted that he slept later than usual. He then noticed drooling from right side of  mouth. When he was brushing his teeth he had difficultly closing the right side of his mouth around toothbrush. He has severe and "random" allergies to different foods and metals, so he chalked it up to mouth/tongue swelling and took allergy medication. His speech was slurred but again he thought this was due to swelling in his mouth. He continued on his vacation and got home Tuesday.   He noticed over the past few days that when he was trying to text with right hand it was very slow, though left hand was fine. Video games were hard to play. Due to these hand changes coupled with the fact his speech remained slurred he decided to seek medical care. Went to urgent care this morning and they told him to come to ED.   Continues to drool while sleeping. Speech is slurred, has stayed the same no better or worse. Feels slightly weak on right side (hand). Describes it as his right hand being slower. Right leg normal, he was able to drive without issue and walk all over [facility] without stumbling or tripping.  Review Of Systems: Per HPI with the following additions:   Review of Systems  Constitutional: Positive for malaise/fatigue. Negative for chills, diaphoresis and fever.  HENT: Negative for congestion and sore throat.   Eyes: Negative for blurred vision and double vision.  Respiratory: Negative for cough and shortness of breath.   Cardiovascular: Negative for chest pain.  Gastrointestinal: Negative for abdominal pain, constipation, diarrhea, nausea and vomiting.  Genitourinary: Negative for dysuria.  Neurological: Positive for speech change and focal weakness. Negative for weakness.    Patient Active Problem List   Diagnosis Date Noted  . Slurred speech 02/15/2017  . Dermatitis 12/26/2016  . Hives  03/19/2013    Past Medical History: History reviewed. No pertinent past medical history.  Food allergies- metals, foods Eczema   Past Surgical History: Past Surgical History:  Procedure Laterality  Date  . FRACTURE SURGERY     R elbow, 2nd grade    Social History: Social History  Substance Use Topics  . Smoking status: Former Games developer  . Smokeless tobacco: Never Used  . Alcohol use No   Additional social history: lives with Wife. Former social smoker. Drinks alcohol every other weekend. No drug use.   Please also refer to relevant sections of EMR.  Family History: Family History  Problem Relation Age of Onset  . Asthma Mother   . Allergies Mother   . Urticaria Mother   . Asthma Brother   . Allergies Brother   . Diabetes Maternal Grandmother   . Diabetes Paternal Grandmother   . Allergic rhinitis Neg Hx   . Angioedema Neg Hx   . Atopy Neg Hx   . Eczema Neg Hx   . Immunodeficiency Neg Hx    No FH of MS Grandmother had DM and heart problems  Allergies and Medications: Allergies  Allergen Reactions  . Nickel   . Other     Fragrances   No current facility-administered medications on file prior to encounter.    Current Outpatient Prescriptions on File Prior to Encounter  Medication Sig Dispense Refill  . cetirizine (ZYRTEC) 10 MG tablet Take 1 tablet (10 mg total) by mouth daily. 30 tablet 1  . clobetasol (OLUX) 0.05 % topical foam Apply topically 2 (two) times daily. 50 g 0  . hydrOXYzine (ATARAX/VISTARIL) 25 MG tablet Take 0.5-1 tablets (12.5-25 mg total) by mouth every 8 (eight) hours as needed for itching. (Patient not taking: Reported on 12/26/2016) 30 tablet 0  . levocetirizine (XYZAL) 5 MG tablet Take 1 tablet (5 mg total) by mouth every evening. 30 tablet 5  . ranitidine (ZANTAC) 150 MG tablet Take 1 tablet (150 mg total) by mouth 2 (two) times daily. 60 tablet 0  . triamcinolone cream (KENALOG) 0.1 % Apply topically 2 (two) times daily. 30 g 0    Objective: BP (!) 146/77   Pulse 62   Temp 98.2 F (36.8 C)   Resp 19   SpO2 99%  Exam: General: pleasant man sitting up in bed in NAD Eyes: PERRLA. EOMI. ENTM: moist mucous membranes, no erythema or  exudate in oropharynx. Symmetrical palate rise. Tongue normal in appearance, normal movements Neck: supple, non-tender, no LAD Cardiovascular: RRR, no MRG. +2 radial pulses bilaterally  Respiratory: CTAB. No increased WOB Gastrointestinal: Soft, NTND. +BS MSK: no edema or cyanosis Derm: faint maculopapular rash over chest, eczema on flexor surfaces Neuro: Speech slurred per patient. Decreased sensation over third distribution of Trigeminal nerve on right. Other cranial nerves in tact. Finger to nose normal. 5/5 strength in upper and lower extremities bilaterally.  Psych: appropriate mood and affect  Labs and Imaging: CBC BMET   Recent Labs Lab 02/15/17 1449  WBC 5.4  HGB 14.0  HCT 44.0  PLT 157    Recent Labs Lab 02/15/17 1449  NA 138  K 4.0  CL 104  CO2 24  BUN 10  CREATININE 1.15  GLUCOSE 100*  CALCIUM 9.4     Ct Head Wo Contrast  Result Date: 02/15/2017 CLINICAL DATA:  Slurred speech EXAM: CT HEAD WITHOUT CONTRAST TECHNIQUE: Contiguous axial images were obtained from the base of the skull through the vertex without intravenous contrast. COMPARISON:  None. FINDINGS: Brain: Subtle area of low-density noted in the deep white matter in the left frontoparietal region. No hemorrhage. No hydrocephalus. No mass lesion. Vascular: No hyperdense vessel or unexpected calcification. Skull: No acute calvarial abnormality. Sinuses/Orbits: Visualized paranasal sinuses and mastoids clear. Orbital soft tissues unremarkable. Other: None IMPRESSION: Subtle low-density in the deep white matter in the left frontoparietal region. Cannot exclude acute infarct. May consider further evaluation with MRI. Electronically Signed   By: Charlett Nose M.D.   On: 02/15/2017 16:00   Tillman Sers, DO 02/15/2017, 7:16 PM PGY-1, [facility] FPTS Intern pager: [phone], text pages welcome  UPPER LEVEL ADDENDUM  I have read the above note and made revisions highlighted in blue.  Noralee Chars, MD, PGY-2 [facility]

## 2017-02-16 ENCOUNTER — Observation Stay (HOSPITAL_COMMUNITY): Payer: BLUE CROSS/BLUE SHIELD

## 2017-02-16 ENCOUNTER — Encounter (HOSPITAL_COMMUNITY): Payer: Self-pay | Admitting: Orthopedic Surgery

## 2017-02-16 ENCOUNTER — Observation Stay (HOSPITAL_BASED_OUTPATIENT_CLINIC_OR_DEPARTMENT_OTHER): Payer: BLUE CROSS/BLUE SHIELD

## 2017-02-16 DIAGNOSIS — Z87891 Personal history of nicotine dependence: Secondary | ICD-10-CM | POA: Diagnosis not present

## 2017-02-16 DIAGNOSIS — G35 Multiple sclerosis: Principal | ICD-10-CM

## 2017-02-16 DIAGNOSIS — Z889 Allergy status to unspecified drugs, medicaments and biological substances status: Secondary | ICD-10-CM

## 2017-02-16 DIAGNOSIS — R4781 Slurred speech: Secondary | ICD-10-CM | POA: Diagnosis not present

## 2017-02-16 DIAGNOSIS — Z91018 Allergy to other foods: Secondary | ICD-10-CM | POA: Diagnosis not present

## 2017-02-16 DIAGNOSIS — G379 Demyelinating disease of central nervous system, unspecified: Secondary | ICD-10-CM

## 2017-02-16 DIAGNOSIS — Z79899 Other long term (current) drug therapy: Secondary | ICD-10-CM | POA: Diagnosis not present

## 2017-02-16 DIAGNOSIS — E559 Vitamin D deficiency, unspecified: Secondary | ICD-10-CM | POA: Diagnosis present

## 2017-02-16 DIAGNOSIS — Z91048 Other nonmedicinal substance allergy status: Secondary | ICD-10-CM | POA: Diagnosis not present

## 2017-02-16 LAB — BASIC METABOLIC PANEL
ANION GAP: 9 (ref 5–15)
BUN: 9 mg/dL (ref 6–20)
CHLORIDE: 100 mmol/L — AB (ref 101–111)
CO2: 28 mmol/L (ref 22–32)
Calcium: 9.6 mg/dL (ref 8.9–10.3)
Creatinine, Ser: 1.11 mg/dL (ref 0.61–1.24)
GFR calc non Af Amer: >60 mL/min (ref >60)
Glucose, Bld: 135 mg/dL — ABNORMAL HIGH (ref 65–99)
POTASSIUM: 4.5 mmol/L (ref 3.5–5.1)
SODIUM: 137 mmol/L (ref 135–145)

## 2017-02-16 LAB — RAPID URINE DRUG SCREEN, HOSP PERFORMED
AMPHETAMINES: NOT DETECTED
Barbiturates: NOT DETECTED
Benzodiazepines: NOT DETECTED
Cocaine: NOT DETECTED
Opiates: NOT DETECTED
TETRAHYDROCANNABINOL: NOT DETECTED

## 2017-02-16 LAB — SEDIMENTATION RATE: Sed Rate: 3 mm/h (ref 0–16)

## 2017-02-16 LAB — VAS US CAROTID
LCCADDIAS: -32 cm/s
LCCADSYS: -134 cm/s
LCCAPDIAS: 21 cm/s
LEFT ECA DIAS: -24 cm/s
LEFT VERTEBRAL DIAS: 22 cm/s
LICADSYS: -90 cm/s
LICAPDIAS: -20 cm/s
Left CCA prox sys: 167 cm/s
Left ICA dist dias: -25 cm/s
Left ICA prox sys: -75 cm/s
RCCAPDIAS: 13 cm/s
RCCAPSYS: 114 cm/s
RIGHT ECA DIAS: -9 cm/s
RIGHT VERTEBRAL DIAS: 15 cm/s
Right cca dist sys: -56 cm/s

## 2017-02-16 LAB — LIPID PANEL
Cholesterol: 90 mg/dL (ref 0–200)
HDL: 22 mg/dL — AB (ref >40)
LDL CALC: 39 mg/dL (ref 0–99)
Total CHOL/HDL Ratio: 4.1 RATIO
Triglycerides: 146 mg/dL (ref <150)
VLDL: 29 mg/dL (ref 0–40)

## 2017-02-16 LAB — CBC
HCT: 45.8 % (ref 39.0–52.0)
Hemoglobin: 14.3 g/dL (ref 13.0–17.0)
MCH: 23.9 pg — AB (ref 26.0–34.0)
MCHC: 31.2 g/dL (ref 30.0–36.0)
MCV: 76.6 fL — AB (ref 78.0–100.0)
PLATELETS: 156 10*3/uL (ref 150–400)
RBC: 5.98 MIL/uL — AB (ref 4.22–5.81)
RDW: 13.4 % (ref 11.5–15.5)
WBC: 6.5 10*3/uL (ref 4.0–10.5)

## 2017-02-16 LAB — C-REACTIVE PROTEIN: CRP: <0.8 mg/dL

## 2017-02-16 LAB — TSH: TSH: 1.517 u[IU]/mL (ref 0.350–4.500)

## 2017-02-16 LAB — HIV ANTIBODY (ROUTINE TESTING W REFLEX): HIV Screen 4th Generation wRfx: NONREACTIVE

## 2017-02-16 NOTE — Progress Notes (Signed)
PT Cancellation Note/Discharge  Patient Details Name: Dylan Mccoy MRN: [id number] DOB: 1980/09/14   Cancelled Treatment:    Reason Eval/Treat Not Completed: OT screened, no needs identified, will sign off.  OT screened for PT needs.  No needs identified.  PT to sign off.  See OT notes for details.   Thanks,    Rollene Rotunda. Danyka Merlin, PT, DPT [phone]   02/16/2017, 4:54 PM

## 2017-02-16 NOTE — Progress Notes (Signed)
Family Medicine Teaching Service Daily Progress Note Intern Pager: [phone]  Patient name: Dylan Mccoy Medical record number: [id number] Date of birth: [birth year] Age: 36 y.o. Gender: male  Primary Care Provider: Patient, No Pcp Per Consultants: neurology, PT/OT, SLP Code Status: full  Pt Overview and Major Events to Date:  6/20- admitted to FPTS  Assessment and Plan: HADYN Mccoy is a 36 y.o. male presenting with slurred speech. PMH is significant for metal and food allergies, eczema  Slurred speech/Numbness of right lower mouth- MRI consistent with MS, neurology feels presentation consistent with CIS. Patient has FH of Merchada-Joseph disease, per his mother. -vitals per floor -neurology following, appreciate recommendations -autoimmune labs pending per neuro -MRI cervical spine to rule out lesion, must wait until contrast clears from MRI head -IV steroids x 5 days -monitor BP   Allergies- on xyzal, claritin, topical kenolog, hydroxyzine prn at home -claritin on formulary and ordered qhs -benadryl q8 as needed for itching  FEN/GI: NPO pending bedside swallow screen then regular diet, pepcid Prophylaxis: lovenox  Disposition: pending IV steroid course  Subjective:  Doing well. Does not feel any improvement in speech, dexterity of right hand. No complaints. Worried about future of disease course.   Objective: Temp:  [98 F (36.7 C)-98.2 F (36.8 C)] 98 F (36.7 C) (06/21 0512) Pulse Rate:  [50-65] 51 (06/21 0512) Resp:  [19-20] 20 (06/21 0512) BP: (127-155)/(57-77) 127/59 (06/21 0512) SpO2:  [96 %-100 %] 96 % (06/21 0512) Weight:  [238 lb (108 kg)] 238 lb (108 kg) (06/20 2100) Physical Exam: General: pleasant man sitting up in NAD Cardiovascular: RRR no MRG Respiratory: CTAB. No increased WOB Abdomen: soft. NTND Extremities: no edema or cyanosis  Laboratory:  Recent Labs Lab 02/15/17 1449 02/16/17 0506  WBC 5.4 PENDING  HGB 14.0 14.3  HCT 44.0  45.8  PLT 157 PENDING    Recent Labs Lab 02/15/17 1449 02/16/17 0506  NA 138 137  K 4.0 4.5  CL 104 100*  CO2 24 28  BUN 10 9  CREATININE 1.15 1.11  CALCIUM 9.4 9.6  PROT 8.2*  --   BILITOT 0.8  --   ALKPHOS 48  --   ALT 49  --   AST 27  --   GLUCOSE 100* 135*    UDS negative TSH 1.517 CRP < 0.08 Lipi panel Total cholesterol 90, HDL 22, LDL 39, Tri 146  Imaging/Diagnostic Tests: Ct Head Wo Contrast  Result Date: 02/15/2017 CLINICAL DATA:  Slurred speech EXAM: CT HEAD WITHOUT CONTRAST TECHNIQUE: Contiguous axial images were obtained from the base of the skull through the vertex without intravenous contrast. COMPARISON:  None. FINDINGS: Brain: Subtle area of low-density noted in the deep white matter in the left frontoparietal region. No hemorrhage. No hydrocephalus. No mass lesion. Vascular: No hyperdense vessel or unexpected calcification. Skull: No acute calvarial abnormality. Sinuses/Orbits: Visualized paranasal sinuses and mastoids clear. Orbital soft tissues unremarkable. Other: None IMPRESSION: Subtle low-density in the deep white matter in the left frontoparietal region. Cannot exclude acute infarct. May consider further evaluation with MRI. Electronically Signed   By: Charlett Nose M.D.   On: 02/15/2017 16:00   Mr Dylan Mccoy And Wo Contrast  Result Date: 02/15/2017 CLINICAL DATA:  RIGHT facial weakness, RIGHT hand numbness beginning 4 days ago while on vacation. EXAM: MRI HEAD WITHOUT AND WITH CONTRAST TECHNIQUE: Multiplanar, multiecho pulse sequences of the brain and surrounding structures were obtained without and with intravenous contrast. CONTRAST:  20mL MULTIHANCE GADOBENATE DIMEGLUMINE 529  MG/ML IV SOLN COMPARISON:  CT HEAD February 15, 2017 at 1556 hours FINDINGS: INTRACRANIAL CONTENTS: LEFT centrum semiovale 23 x 18 mm FLAIR T2 hyperintense lesion with marginal reduced diffusion, central T2 shine through and discontinuous marginal enhancement. Faint reduced diffusion and  enhancement RIGHT cerebral peduncle 6 mm lesion. Small juxta cortical LEFT parietal lesion. At least 6 additional supratentorial white matter lesions, many of which radiate from the periventricular margin with low T1 signal compatible with black holes of demyelination. Prominent perivascular spaces. Ventricles and sulci normal for patient's age, cavum septum pellucidum. No midline shift or mass effect. No abnormal extra-axial fluid collections or abnormal extra-axial enhancement. VASCULAR: Normal major intracranial vascular flow voids present at skull base. SKULL AND UPPER CERVICAL SPINE: No abnormal sellar expansion. No suspicious calvarial bone marrow signal. Craniocervical junction maintained. SINUSES/ORBITS: The mastoid air-cells and included paranasal sinuses are well-aerated.The included ocular globes and orbital contents are non-suspicious. OTHER: None. IMPRESSION: Multiple supratentorial lesions compatible with acute on chronic demyelination including enhancing 23 x 18 mm LEFT centrum semiovale tumefactive lesion and, subcentimeter RIGHT cerebral peduncle lesion. No parenchymal brain volume loss for age. Acute findings discussed with and reconfirmed by Dr.COURTENEY MACKUEN on 02/15/2017 at 8:30 pm. Electronically Signed   By: Awilda Metro M.D.   On: 02/15/2017 20:50     Tillman Sers, DO 02/16/2017, 8:07 AM PGY-1, [facility] FPTS Intern pager: [phone], text pages welcome

## 2017-02-16 NOTE — Progress Notes (Signed)
Subjective: Still having dysarthria and numbness on his right tongue.  Also feels as though his grip is slightly weaker  Exam: Vitals:   02/16/17 0512 02/16/17 0945  BP: (!) 127/59 (!) 133/58  Pulse: (!) 51 64  Resp: 20 20  Temp: 98 F (36.7 C) 98.1 F (36.7 C)    HEENT-  Normocephalic, no lesions, without obvious abnormality.  Normal external eye and conjunctiva.  Normal TM's bilaterally.  Normal auditory canals and external ears. Normal external nose, mucus membranes and septum.  Normal pharynx.    Neuro:  CN: Pupils are equal and round. They are symmetrically reactive from 3-->2 mm. EOMI without nystagmus. Facial sensation is intact to light touch. Face is symmetric at rest with normal strength and mobility. Hearing is intact to conversational voice. Palate elevates symmetrically and uvula is midline. Voice is normal in tone, pitch and quality. Bilateral SCM and trapezii are 5/5. Tongue is midline with normal bulk and mobility.  Motor: Normal bulk, tone, and strength. 5/5 throughout. No drift.  Sensation: Intact to light touch.  DTRs: 2+, symmetric  Toes downgoing bilaterally. No pathologic reflexes.  Coordination: Finger-to-nose and heel-to-shin are without dysmetria     Pertinent Labs/Diagnostics: MRI brain: IMPRESSION: Multiple supratentorial lesions compatible with acute on chronic demyelination including enhancing 23 x 18 mm LEFT centrum semiovale tumefactive lesion and, subcentimeter RIGHT cerebral peduncle lesion.  No parenchymal brain volume loss for age  Felicie Morn PA-C [facility] [phone]  Impression: 36 year old male with single clinical episode with strong radiographic support for multiple sclerosis. With some enhancing and some non-enhancing lesions, I feel that MS is very much the most likely diagnosis.   Recommendations: 1) MRI cervical spine pending 2) 4 more doses solumedrol 3) Serum ANA ordered  4. Will follow. Ritta Slot, MD [facility] [phone]  If 7pm- 7am, please page neurology on call as listed in AMION.  02/16/2017, 10:19 AM

## 2017-02-16 NOTE — Progress Notes (Signed)
Occupational Therapy Evaluation Patient Details Name: Dylan Mccoy MRN: [id number] DOB: 21-Oct-1980 Today's Date: 02/16/2017    History of Present Illness 36 year old previously healthy male who presented to [facility] ED with slurred speech and right hand clumsiness. He was admitted to [facility] for management. MRI brain demonstrated evidence of demyelinating disease   Clinical Impression   Pt independent with ADL and mobility. Pt complains of R hand being "clumsy". Pt educated on HEP for R hand fine motor/coordination and strengthening. Written information given. Pt began asking quesitons about MS. Recommended pt write these questions down and discuss with neurology. OT signing off.    Follow Up Recommendations  No OT follow up    Equipment Recommendations  None recommended by OT    Recommendations for Other Services       Precautions / Restrictions Precautions Precautions: None      Mobility Bed Mobility Overal bed mobility: Independent                Transfers Overall transfer level: Independent                    Balance Overall balance assessment: No apparent balance deficits (not formally assessed)                                         ADL either performed or assessed with clinical judgement   ADL Overall ADL's : At baseline                                             Vision Baseline Vision/History: No visual deficits       Perception     Praxis      Pertinent Vitals/Pain Pain Assessment: No/denies pain     Hand Dominance Left   Extremity/Trunk Assessment Upper Extremity Assessment Upper Extremity Assessment: RUE deficits/detail RUE Deficits / Details: mild decrease grip strength; decreased coordination RUE Coordination: decreased fine motor   Lower Extremity Assessment Lower Extremity Assessment: Overall WFL for tasks assessed   Cervical / Trunk Assessment Cervical / Trunk Assessment:  Normal   Communication Communication Communication: Expressive difficulties ("slurred speech" per pt)   Cognition Arousal/Alertness: Awake/alert Behavior During Therapy: WFL for tasks assessed/performed Overall Cognitive Status: Within Functional Limits for tasks assessed                                     General Comments       Exercises Exercises: Other exercises Other Exercises Other Exercises: fine motor/coordination Other Exercises: theraputty grip/pinch strengthening   Shoulder Instructions      Home Living Family/patient expects to be discharged to:: Private residence Living Arrangements: Spouse/significant other                               Additional Comments: works with computers      Prior Functioning/Environment Level of Independence: Independent        Comments: active with sports/drives        OT Problem List: Decreased strength;Decreased coordination      OT Treatment/Interventions:      OT Goals(Current goals can be found  in the care plan section) Acute Rehab OT Goals Patient Stated Goal: to get back to normal OT Goal Formulation: All assessment and education complete, DC therapy  OT Frequency:     Barriers to D/C:            Co-evaluation              AM-PAC PT "6 Clicks" Daily Activity     Outcome Measure Help from another person eating meals?: None Help from another person taking care of personal grooming?: None Help from another person toileting, which includes using toliet, bedpan, or urinal?: None Help from another person bathing (including washing, rinsing, drying)?: None Help from another person to put on and taking off regular upper body clothing?: None Help from another person to put on and taking off regular lower body clothing?: None 6 Click Score: 24   End of Session Nurse Communication: Mobility status  Activity Tolerance: Patient tolerated treatment well Patient left: in bed;with  call bell/phone within reach;with family/visitor present  OT Visit Diagnosis: Muscle weakness (generalized) (M62.81)                Time: 6578-4696 OT Time Calculation (min): 20 min Charges:  OT General Charges $OT Visit: 1 Procedure OT Evaluation $OT Eval Low Complexity: 1 Procedure G-Codes: OT G-codes **NOT FOR INPATIENT CLASS** Functional Assessment Tool Used: Clinical judgement Functional Limitation: Self care Self Care Current Status (E9528): 0 percent impaired, limited or restricted Self Care Goal Status (U1324): 0 percent impaired, limited or restricted Self Care Discharge Status (M0102): 0 percent impaired, limited or restricted   [facility], OT/L  [phone] 02/16/2017  Kaysey Berndt,HILLARY 02/16/2017, 4:54 PM

## 2017-02-16 NOTE — Progress Notes (Signed)
SLP Cancellation Note  Patient Details Name: Dylan Mccoy MRN: [id number] DOB: October 05, 1980   Cancelled treatment:       Reason Eval/Treat Not Completed: Other (comment) (Pt in the shower at the time of therapist's arrival.  Will follow up at next available appointment. )   Laurana Magistro, Melanee Spry 02/16/2017, 2:05 PM

## 2017-02-16 NOTE — Progress Notes (Signed)
*  PRELIMINARY RESULTS* Vascular Ultrasound Carotid Duplex (Doppler) has been completed.  Preliminary findings: Bilateral: No significant ICA stenosis. Antegrade vertebral flow.   Farrel Demark, RDMS, RVT  02/16/2017, 11:42 AM

## 2017-02-16 NOTE — Progress Notes (Signed)
Pt had  Gadolinium  Contrast for Brain MRI on 6/10 at 730pm.  Per MRI policy, pt must wait for 48 hours and have a new creatinine  Drawn prior to having the contrast again.  Thank you

## 2017-02-16 NOTE — Discharge Summary (Signed)
Family Medicine Teaching [facility] Discharge Summary  Patient name: Dylan Mccoy Medical record number: [id number] Date of birth: [birth year] Age: 36 y.o. Gender: male Date of Admission: 02/15/2017  Date of Discharge: 02/19/17  Admitting Physician: Nestor Ramp, MD  Primary Care Provider: Patient, No Pcp Per Consultants: neurology, PT/OT/SLP  Indication for Hospitalization: slurred speech  Discharge Diagnoses/Problem List:  Patient Active Problem List   Diagnosis Date Noted  . Vitamin D deficiency   . Multiple sclerosis (HCC)   . Clinically isolated syndrome (HCC)   . Multiple allergies   . Slurred speech 02/15/2017  . Dermatitis 12/26/2016  . Hives 03/19/2013   Disposition: home  Discharge Condition: stable, improved  Discharge Exam: see progress note from day of discharge  Brief Hospital Course:  Dylan Mccoy is a 35 year old previously healthy male who presented to [facility] ED with slurred speech and right hand clumsiness. He was admitted to [facility] for management. MRI brain demonstrated evidence of demyelinating disease consistent with MS vs CIS. Neurology was consulted. Patient was started on IV steroids for 5 days with improvement in his symptoms. MRI C-spine was consistent with demyelinating lesions and MS. PT and OT did not recommend any follow up. SLP recommended outpatient follow up. Patient completed his course of IV steroids with improvement of symptoms and was discharged home in stable condition on 02/19/17 with close neurology follow up.    Issues for Follow Up:  1. Follow up with [facility] for MS as scheduled  Significant Procedures: none  Significant Labs and Imaging:   Recent Labs Lab 02/17/17 0628 02/18/17 0330 02/19/17 0335  WBC 17.7* 18.8* 17.3*  HGB 13.0 12.7* 12.6*  HCT 40.9 40.6 39.9  PLT 181 194 167    Recent Labs Lab 02/15/17 1449 02/16/17 0506 02/17/17 0628 02/18/17 0330 02/19/17 0335  NA 138 137 137 136 136  K 4.0 4.5 3.9 4.0  3.7  CL 104 100* 101 101 100*  CO2 24 28 28 26 29   GLUCOSE 100* 135* 150* 137* 143*  BUN 10 9 17  22* 17  CREATININE 1.15 1.11 1.07 1.09 1.10  CALCIUM 9.4 9.6 9.3 9.2 8.8*  ALKPHOS 48  --   --   --   --   AST 27  --   --   --   --   ALT 49  --   --   --   --   ALBUMIN 4.7  --   --   --   --    UDS negative TSH 1.517 CRP < 0.08 Lipid panel Total cholesterol 90, HDL 22, LDL 39, Tri 146 ANA, B burgdorfi, Anti-Jo 1 Ab, extractable nuclear antigben ab panel: negative  Ct Head Wo Contrast Result Date: 02/15/2017 IMPRESSION: Subtle low-density in the deep white matter in the left frontoparietal region. Cannot exclude acute infarct. May consider further evaluation with MRI. Electronically Signed   By: Charlett Nose M.D.   On: 02/15/2017 16:00   Mr Laqueta Jean And Wo Contrast Result Date: 02/15/2017 IMPRESSION: Multiple supratentorial lesions compatible with acute on chronic demyelination including enhancing 23 x 18 mm LEFT centrum semiovale tumefactive lesion and, subcentimeter RIGHT cerebral peduncle lesion. No parenchymal brain volume loss for age. Acute findings discussed with and reconfirmed by Dr.COURTENEY MACKUEN on 02/15/2017 at 8:30 pm. Electronically Signed   By: Awilda Metro M.D.   On: 02/15/2017 20:50   MRI C-Spine Result Date: 02/17/2017 IMPRESSION: Nonenhancing cord lesion within the anterior cord at the C4  level compatible with demyelination and history of multiple sclerosis. Electronically Signed   By: Mitzi Hansen M.D.   On: 02/17/2017 21:40    Results/Tests Pending at Time of Discharge: none  Discharge Medications:  Allergies as of 02/19/2017      Reactions   Nickel    Other    Fragrances      Medication List    TAKE these medications   clobetasol 0.05 % topical foam Commonly known as:  OLUX Apply topically 2 (two) times daily.   diphenhydrAMINE 25 mg capsule Commonly known as:  BENADRYL Take 25 mg by mouth every 6 (six) hours as needed for  allergies.       Discharge Instructions: Please refer to Patient Instructions section of EMR for full details.  Patient was counseled important signs and symptoms that should prompt return to medical care, changes in medications, dietary instructions, activity restrictions, and follow up appointments.   Follow-Up Appointments: Follow-up Information    Sater, Pearletha Furl, MD. Call.   Specialty:  Neurology Why:  for hospital fup Contact information: [address] [place] [address] [phone]           Tillman Sers, DO 02/19/2017, 1:53 PM PGY-1, [facility]

## 2017-02-17 ENCOUNTER — Inpatient Hospital Stay (HOSPITAL_COMMUNITY): Payer: BLUE CROSS/BLUE SHIELD

## 2017-02-17 DIAGNOSIS — E559 Vitamin D deficiency, unspecified: Secondary | ICD-10-CM | POA: Diagnosis present

## 2017-02-17 LAB — EXTRACTABLE NUCLEAR ANTIGEN ANTIBODY
ENA SM Ab Ser-aCnc: <0.2 AI (ref 0.0–0.9)
Ribonucleic Protein: <0.2 AI (ref 0.0–0.9)
SSB (La) (ENA) Antibody, IgG: <0.2 AI (ref 0.0–0.9)
ds DNA Ab: 1 IU/mL (ref 0–9)

## 2017-02-17 LAB — BASIC METABOLIC PANEL
Anion gap: 8 (ref 5–15)
BUN: 17 mg/dL (ref 6–20)
CHLORIDE: 101 mmol/L (ref 101–111)
CO2: 28 mmol/L (ref 22–32)
CREATININE: 1.07 mg/dL (ref 0.61–1.24)
Calcium: 9.3 mg/dL (ref 8.9–10.3)
GFR calc non Af Amer: >60 mL/min (ref >60)
Glucose, Bld: 150 mg/dL — ABNORMAL HIGH (ref 65–99)
Potassium: 3.9 mmol/L (ref 3.5–5.1)
Sodium: 137 mmol/L (ref 135–145)

## 2017-02-17 LAB — HEMOGLOBIN A1C
HEMOGLOBIN A1C: 5 % (ref 4.8–5.6)
Mean Plasma Glucose: 97 mg/dL

## 2017-02-17 LAB — CBC
HEMATOCRIT: 40.9 % (ref 39.0–52.0)
Hemoglobin: 13 g/dL (ref 13.0–17.0)
MCH: 24.3 pg — AB (ref 26.0–34.0)
MCHC: 31.8 g/dL (ref 30.0–36.0)
MCV: 76.4 fL — ABNORMAL LOW (ref 78.0–100.0)
Platelets: 181 10*3/uL (ref 150–400)
RBC: 5.35 MIL/uL (ref 4.22–5.81)
RDW: 13.7 % (ref 11.5–15.5)
WBC: 17.7 10*3/uL — ABNORMAL HIGH (ref 4.0–10.5)

## 2017-02-17 LAB — ANTI-JO 1 ANTIBODY, IGG: Anti JO-1: <0.2 AI (ref 0.0–0.9)

## 2017-02-17 LAB — B. BURGDORFI ANTIBODIES: B burgdorferi Ab IgG+IgM: <0.91 {ISR} (ref 0.00–0.90)

## 2017-02-17 LAB — ANTINUCLEAR ANTIBODIES, IFA: ANTINUCLEAR ANTIBODIES, IFA: NEGATIVE

## 2017-02-17 LAB — VITAMIN D 25 HYDROXY (VIT D DEFICIENCY, FRACTURES): VIT D 25 HYDROXY: 17.8 ng/mL — AB (ref 30.0–100.0)

## 2017-02-17 MED ORDER — VITAMIN D 1000 UNITS PO TABS
1000.0000 [IU] | ORAL_TABLET | Freq: Every day | ORAL | Status: DC
  Administered 2017-02-17 – 2017-02-19 (×3): 1000 [IU] via ORAL
  Filled 2017-02-17 (×3): qty 1

## 2017-02-17 MED ORDER — GADOBENATE DIMEGLUMINE 529 MG/ML IV SOLN
20.0000 mL | Freq: Once | INTRAVENOUS | Status: AC | PRN
  Administered 2017-02-17: 20 mL via INTRAVENOUS

## 2017-02-17 NOTE — Progress Notes (Signed)
Subjective: Patient feels that he has more sensation and mobility of his right hand today. He still has slurred speech but feels that there is less numbness on the left side of his face.  Exam: Vitals:   02/17/17 0035 02/17/17 0526  BP: (!) 139/54 (!) 120/58  Pulse: (!) 59 71  Resp: 20 18  Temp: 97.9 F (36.6 C) 97.6 F (36.4 C)    HEENT-  Normocephalic, no lesions, without obvious abnormality.  Normal external eye and conjunctiva.  Normal TM's bilaterally.  Normal auditory canals and external ears. Normal external nose, mucus membranes and septum.  Normal pharynx.    Neuro:  CN: Pupils are equal and round. They are symmetrically reactive from 3-->2 mm. EOMI without nystagmus. Facial sensation is intact to light touch. Face is symmetric at rest with normal strength and mobility. Hearing is intact to conversational voice. Palate elevates symmetrically and uvula is midline. Voice Dysarthric. Bilateral SCM and trapezii are 5/5. Tongue is midline with normal bulk and mobility.  Motor: Normal bulk, tone, and strength. 5/5 throughout. No drift.  Sensation: Intact to light touch.  DTRs: 2+, symmetric  Toes downgoing bilaterally. No pathologic reflexes.  Coordination: Finger-to-nose and heel-to-shin are without dysmetria     Pertinent Labs/Diagnostics: None  Felicie Morn PA-C [facility] [phone]  Impression: 36 year old male with single clinical episode with strong radiographic support for multiple sclerosis. With some enhancing and some non-enhancing lesions, MS is very much the most likely diagnosis. Improving with Solu-Medrol.  Recommendations: 1) MRI cervical spine pending 2) 3 more doses solumedrol 3) Serum ANA ordered  4. Will follow. Ritta Slot, MD [facility] [phone]  If 7pm- 7am, please page neurology on call as listed in AMION.  02/17/2017, 9:33 AM

## 2017-02-17 NOTE — Progress Notes (Signed)
  Family Medicine Teaching Service Daily Progress Note Intern Pager: [phone]  Patient name: Dylan Mccoy Medical record number: [id number] Date of birth: 1981-01-24 Age: 36 y.o. Gender: male  Primary Care Provider: Patient, No Pcp Per Consultants: neurology, PT/OT, SLP Code Status: full  Pt Overview and Major Events to Date:  6/20- admitted to FPTS  Assessment and Plan: NAJE KEMMERLING is a 36 y.o. male presenting with slurred speech. PMH is significant for metal and food allergies, eczema  Slurred speech/Numbness of right lower mouth- MRI consistent with MS, neurology feels presentation possibly consistent with CIS, more likely MS. Patient has FH of Merchada-Joseph disease, per his mother. -vitals per floor -neurology following, appreciate recommendations -autoimmune labs pending per neuro -MRI cervical spine to rule out lesion, must wait until contrast clears from MRI head -IV steroids x 5 days, on day 3/5 -monitor BP  -PT/OT- no follow up -SLP eval pending  Allergies- on xyzal, claritin, topical kenolog, hydroxyzine prn at home -claritin on formulary and ordered qhs -benadryl q8 as needed for itching  Vitamin D deficiency- vit D low at 17.8  -replete with 1,000 units daily, continue as outpatient, can increase dose if needed  FEN/GI: regular diet, pepcid Prophylaxis: lovenox  Disposition: pending IV steroid course, last dose 6/24  Subjective:  Feels improved today, speech has improved and his right hand speed has returned to normal. Pt and wife encouraged by this. No concerns or complaints at this time  Objective: Temp:  [97.6 F (36.4 C)-98.2 F (36.8 C)] 97.6 F (36.4 C) (06/22 0526) Pulse Rate:  [59-88] 71 (06/22 0526) Resp:  [18-20] 18 (06/22 0526) BP: (120-139)/(54-84) 120/58 (06/22 0526) SpO2:  [97 %-99 %] 99 % (06/22 0526) Physical Exam: General: pleasant man laying in bed in NAD Cardiovascular: RRR no MRG Respiratory: CTAB. No increased  WOB Abdomen: soft. NTND Extremities: no edema or cyanosis  Laboratory:  Recent Labs Lab 02/15/17 1449 02/16/17 0506 02/17/17 0628  WBC 5.4 6.5 17.7*  HGB 14.0 14.3 13.0  HCT 44.0 45.8 40.9  PLT 157 156 181    Recent Labs Lab 02/15/17 1449 02/16/17 0506 02/17/17 0628  NA 138 137 137  K 4.0 4.5 3.9  CL 104 100* 101  CO2 24 28 28   BUN 10 9 17   CREATININE 1.15 1.11 1.07  CALCIUM 9.4 9.6 9.3  PROT 8.2*  --   --   BILITOT 0.8  --   --   ALKPHOS 48  --   --   ALT 49  --   --   AST 27  --   --   GLUCOSE 100* 135* 150*   UDS negative TSH 1.517 CRP < 0.08, Sed Rate 3  Lipi panel Total cholesterol 90, HDL 22, LDL 39, Tri 146 A1C 5.0 Vit D 17.8 HIV NR  Imaging/Diagnostic Tests: Vas Carotid US Summary: No significant extracranial carotid artery stenosis demonstrated. Vertebrals are patent with antegrade flow.   Tillman Sers, DO 02/17/2017, 8:51 AM PGY-1, [facility] FPTS Intern pager: [phone], text pages welcome

## 2017-02-18 LAB — CBC
HCT: 40.6 % (ref 39.0–52.0)
HEMOGLOBIN: 12.7 g/dL — AB (ref 13.0–17.0)
MCH: 23.7 pg — ABNORMAL LOW (ref 26.0–34.0)
MCHC: 31.3 g/dL (ref 30.0–36.0)
MCV: 75.7 fL — ABNORMAL LOW (ref 78.0–100.0)
Platelets: 194 10*3/uL (ref 150–400)
RBC: 5.36 MIL/uL (ref 4.22–5.81)
RDW: 13.4 % (ref 11.5–15.5)
WBC: 18.8 10*3/uL — AB (ref 4.0–10.5)

## 2017-02-18 LAB — BASIC METABOLIC PANEL
ANION GAP: 9 (ref 5–15)
BUN: 22 mg/dL — ABNORMAL HIGH (ref 6–20)
CO2: 26 mmol/L (ref 22–32)
Calcium: 9.2 mg/dL (ref 8.9–10.3)
Chloride: 101 mmol/L (ref 101–111)
Creatinine, Ser: 1.09 mg/dL (ref 0.61–1.24)
Glucose, Bld: 137 mg/dL — ABNORMAL HIGH (ref 65–99)
POTASSIUM: 4 mmol/L (ref 3.5–5.1)
SODIUM: 136 mmol/L (ref 135–145)

## 2017-02-18 NOTE — Progress Notes (Signed)
Family Medicine Teaching Service Daily Progress Note Intern Pager: [phone]  Patient name: Dylan Mccoy Medical record number: [id number] Date of birth: 02-Nov-1980 Age: 36 y.o. Gender: male  Primary Care Provider: Patient, No Pcp Per Consultants: neurology, PT/OT, SLP Code Status: full  Pt Overview and Major Events to Date:  6/20- admitted to FPTS  Assessment and Plan: [name] is a 36 y.o. male presenting with slurred speech. PMH is significant for metal and food allergies, eczema  Slurred speech/Numbness of right lower mouth, improving. Per neurology, mostly likely MS. MRI brain multiple supratentorial lesions and MRI C spine C4 anterior cord consistent nonenhancing lesion c/w demyelination.  Patient has FH of Merchada-Joseph disease -vitals per floor -Neurology following, appreciate recommendations  -autoimmune labs neg  -IV solumedrol x 5 days, on day 4/5 -monitor BP  -PT/OT- no follow up -SLP eval pending  Allergies- on xyzal, claritin, topical kenolog, hydroxyzine prn at home -claritin qhs -benadryl q8 as needed for itching  Vitamin D deficiency- vit D low at 17.8  -Continue vit D 1,000 units daily, continue as outpatient, can increase dose if needed  FEN/GI: regular diet, pepcid Prophylaxis: lovenox  Disposition: pending IV steroid course, last dose 6/24  Subjective:  No acute events overnight. Continues to improve, speech has improved but not quite back to baseline per patient and continues to have R facial droop. His right hand speed has returned to normal. Pt and wife encouraged by this. No concerns or complaints at this time  Objective: Temp:  [97.6 F (36.4 C)-98.1 F (36.7 C)] 97.6 F (36.4 C) (06/23 0500) Pulse Rate:  [51-70] 54 (06/23 0500) Resp:  [20] 20 (06/23 0500) BP: (113-156)/(45-64) 124/64 (06/23 0500) SpO2:  [98 %-100 %] 98 % (06/23 0500) Physical Exam: General: pleasant man laying in bed in NAD Cardiovascular: RRR no  MRG Respiratory: CTAB. No increased WOB Abdomen: soft. NTND Extremities: no edema or cyanosis Neuro: alert and oriented. PERRL, EOMI. Mild R facial droop at mouth. Speech mildly dysarthric. Sensation intact and strength 5/5 throughout.  Laboratory:  Recent Labs Lab 02/16/17 0506 02/17/17 0628 02/18/17 0330  WBC 6.5 17.7* 18.8*  HGB 14.3 13.0 12.7*  HCT 45.8 40.9 40.6  PLT 156 181 194    Recent Labs Lab 02/15/17 1449 02/16/17 0506 02/17/17 0628 02/18/17 0330  NA 138 137 137 136  K 4.0 4.5 3.9 4.0  CL 104 100* 101 101  CO2 24 28 28 26   BUN 10 9 17  22*  CREATININE 1.15 1.11 1.07 1.09  CALCIUM 9.4 9.6 9.3 9.2  PROT 8.2*  --   --   --   BILITOT 0.8  --   --   --   ALKPHOS 48  --   --   --   ALT 49  --   --   --   AST 27  --   --   --   GLUCOSE 100* 135* 150* 137*   UDS negative TSH 1.517 CRP < 0.08, Sed Rate 3  A1C 5.0 Vit D 17.8 HIV NR  ANA, B burgdorfi, Anti-Jo 1 Ab, extractable nuclear antigben ab panel: negative  Lipid Panel     Component Value Date/Time   CHOL 90 02/16/2017 0506   TRIG 146 02/16/2017 0506   HDL 22 (L) 02/16/2017 0506   CHOLHDL 4.1 02/16/2017 0506   VLDL 29 02/16/2017 0506   LDLCALC 39 02/16/2017 0506    Imaging/Diagnostic Tests: Mr Cervical Spine W Wo Contrast  Result Date: 02/17/2017 CLINICAL  DATA:  36 y/o M; multiple sclerosis with right face and hand sensory numbness in conjunction with slurred speech. EXAM: MRI CERVICAL SPINE WITHOUT AND WITH CONTRAST TECHNIQUE: Multiplanar and multiecho pulse sequences of the cervical spine, to include the craniocervical junction and cervicothoracic junction, were obtained without and with intravenous contrast. CONTRAST:  20mL MULTIHANCE GADOBENATE DIMEGLUMINE 529 MG/ML IV SOLN COMPARISON:  02/15/2017 MRI of the head FINDINGS: Alignment: Straightening of cervical lordosis. Vertebrae: No fracture, evidence of discitis, or bone lesion. No abnormal enhancement. Cord: Single nonenhancing T2 hyperintense  cord lesion within the central anterior cord at the C4 level. No abnormal enhancement of the cord. Posterior Fossa, vertebral arteries, paraspinal tissues: Negative. Disc levels: C2-3: Small central disc protrusion with ventral thecal sac effacement and minimal anterior cord flattening. No significant canal stenosis or foraminal narrowing. C3-4: No significant disc displacement, foraminal narrowing, or canal stenosis. C4-5: Small central disc protrusion with slight anterior cord impingement and flattening. No significant foraminal narrowing or canal stenosis. C5-6: No significant disc displacement, foraminal narrowing, or canal stenosis. C6-7: No significant disc displacement, foraminal narrowing, or canal stenosis. C7-T1: No significant disc displacement, foraminal narrowing, or canal stenosis. IMPRESSION: Nonenhancing cord lesion within the anterior cord at the C4 level compatible with demyelination and history of multiple sclerosis. Electronically Signed   By: Mitzi Hansen M.D.   On: 02/17/2017 21:40     Leland Her, DO 02/18/2017, 8:25 AM PGY-1, [facility] FPTS Intern pager: [phone], text pages welcome

## 2017-02-18 NOTE — Progress Notes (Signed)
Subjective: Continues to improve.   Exam: Vitals:   02/18/17 0500 02/18/17 0950  BP: 124/64 (!) 147/73  Pulse: (!) 54 69  Resp: 20 19  Temp: 97.6 F (36.4 C) 97.9 F (36.6 C)    HEENT-  Normocephalic, no lesions, without obvious abnormality.  Normal external eye and conjunctiva.  Normal TM's bilaterally.  Normal auditory canals and external ears. Normal external nose, mucus membranes and septum.  Normal pharynx.    Neuro:  CN: Pupils are equal and round. Facial assymetry improved some, as is dysarthria.  Motor: Normal bulk, tone, and strength. Possible mild right arm weaknews, improving.  Sensation: Intact to light touch.  DTRs: 2+, symmetric  Toes downgoing bilaterally. No pathologic reflexes.  Coordination: Finger-to-nose and heel-to-shin are without dysmetria     Impression: 36 year old male with single clinical episode with strong radiographic support for multiple sclerosis. With some enhancing and some non-enhancing lesions, MS is very much the most likely diagnosis. The C-spine imaging is supportive of this. Improving with Solu-Medrol.  Recommendations: 1) 2 more doses solumedrol 2) Will follow. Ritta Slot, MD [facility] [phone]  If 7pm- 7am, please page neurology on call as listed in AMION.  02/18/2017, 10:45 AM

## 2017-02-19 LAB — CBC
HCT: 39.9 % (ref 39.0–52.0)
HEMOGLOBIN: 12.6 g/dL — AB (ref 13.0–17.0)
MCH: 23.9 pg — ABNORMAL LOW (ref 26.0–34.0)
MCHC: 31.6 g/dL (ref 30.0–36.0)
MCV: 75.7 fL — ABNORMAL LOW (ref 78.0–100.0)
Platelets: 167 10*3/uL (ref 150–400)
RBC: 5.27 MIL/uL (ref 4.22–5.81)
RDW: 13.1 % (ref 11.5–15.5)
WBC: 17.3 10*3/uL — ABNORMAL HIGH (ref 4.0–10.5)

## 2017-02-19 LAB — BASIC METABOLIC PANEL
ANION GAP: 7 (ref 5–15)
BUN: 17 mg/dL (ref 6–20)
CALCIUM: 8.8 mg/dL — AB (ref 8.9–10.3)
CO2: 29 mmol/L (ref 22–32)
Chloride: 100 mmol/L — ABNORMAL LOW (ref 101–111)
Creatinine, Ser: 1.1 mg/dL (ref 0.61–1.24)
Glucose, Bld: 143 mg/dL — ABNORMAL HIGH (ref 65–99)
POTASSIUM: 3.7 mmol/L (ref 3.5–5.1)
Sodium: 136 mmol/L (ref 135–145)

## 2017-02-19 NOTE — Progress Notes (Signed)
CCMD contracted RN to advise that patient HR dropped down to 38.  RN checked on patient, who was asleep.  Patient advised that is ok and in no type of pain. Patient asymptomatic  RN updated provider on call - provider advised that since is asymptomatic that plan is to observe   RN will continue to monitor patient

## 2017-02-19 NOTE — Progress Notes (Signed)
CCMD contacted RN to advise that patient current HR is 42.  Patient was asleep when RN entered the room, Patient states that he feels good.  Patient asymptomatic  RN will continue to monitor patient

## 2017-02-19 NOTE — Progress Notes (Signed)
Subjective: Continues to improve.   Exam: Vitals:   02/19/17 0251 02/19/17 0550  BP: (!) 127/57 (!) 129/59  Pulse: 87 (!) 41  Resp: 19 18  Temp: 97.9 F (36.6 C) 97.6 F (36.4 C)    HEENT-  Normocephalic, no lesions, without obvious abnormality.  Normal external eye and conjunctiva.  Normal TM's bilaterally.  Normal auditory canals and external ears. Normal external nose, mucus membranes and septum.  Normal pharynx.    Neuro:  CN: Pupils are equal and round. Facial asymmetry improved but still present, as is dysarthria.  Motor: Normal bulk, tone, and strength. Right arm/hand weakness has improved.  Sensation: Intact to light touch.  DTRs: 2+, symmetric  Toes downgoing bilaterally. No pathologic reflexes.  Coordination: Finger-to-nose and heel-to-shin are without dysmetria     Impression: 36 year old male with single clinical episode with strong radiographic support for multiple sclerosis. With some enhancing and some non-enhancing lesions, MS is very much the most likely diagnosis. The C-spine imaging is supportive of this. Improving with Solu-Medrol.  Recommendations: 1) Last dose solumedrol today.  2) I have advised follow up with Dr. Epimenio Foot of [facility] for MS treatment. I have requested this appointment.   Ritta Slot, MD [facility] [phone]  If 7pm- 7am, please page neurology on call as listed in AMION.  02/19/2017, 8:42 AM

## 2017-02-19 NOTE — Progress Notes (Signed)
Family Medicine Teaching Service Daily Progress Note Intern Pager: [phone]  Patient name: Dylan Mccoy Medical record number: [id number] Date of birth: 1981-07-28 Age: 36 y.o. Gender: male  Primary Care Provider: Patient, No Pcp Per Consultants: neurology, PT/OT, SLP Code Status: full  Pt Overview and Major Events to Date:  6/20- admitted to FPTS, started on IV steroids for 5 day course  Assessment and Plan: Dylan Mccoy is a 36 y.o. male presenting with slurred speech. PMH is significant for metal and food allergies, eczema  Slurred speech/Numbness of right lower mouth, improving. Per neurology, mostly likely MS. MRI brain multiple supratentorial lesions and MRI C spine C4 anterior cord consistent nonenhancing lesion c/w demyelination.  Patient has FH of Merchada-Joseph disease -vitals per floor -Neurology following, appreciate recommendations -IV solumedrol x 5 days, on day 5/5 -monitor BP  -SLP eval pending  Allergies- on xyzal, claritin, topical kenolog, hydroxyzine prn at home -claritin qhs -benadryl q8 as needed for itching  Vitamin D deficiency- vit D low at 17.8  -Continue vit D 1,000 units daily, continue as outpatient, can increase dose if needed  FEN/GI: regular diet, pepcid Prophylaxis: lovenox  Disposition: home pending completion of IV steroids today  Subjective:  Doing well, hoping to go home today. Speech not at baseline yet but hand is back to normal.   Objective: Temp:  [97.6 F (36.4 C)-98 F (36.7 C)] 97.6 F (36.4 C) (06/24 0550) Pulse Rate:  [41-87] 41 (06/24 0550) Resp:  [18-20] 18 (06/24 0550) BP: (127-147)/(57-75) 129/59 (06/24 0550) SpO2:  [97 %-99 %] 97 % (06/24 0550) Physical Exam: General: pleasant man laying in bed in NAD Cardiovascular: RRR no MRG Respiratory: CTAB. No increased WOB Abdomen: soft. NTND Extremities: no edema or cyanosis Neuro: alert and oriented. PERRL, EOMI. Mild R facial droop at mouth. Speech mildly  dysarthric. Sensation intact and strength 5/5 throughout.  Laboratory:  Recent Labs Lab 02/17/17 0628 02/18/17 0330 02/19/17 0335  WBC 17.7* 18.8* 17.3*  HGB 13.0 12.7* 12.6*  HCT 40.9 40.6 39.9  PLT 181 194 167    Recent Labs Lab 02/15/17 1449  02/17/17 0628 02/18/17 0330 02/19/17 0335  NA 138  < > 137 136 136  K 4.0  < > 3.9 4.0 3.7  CL 104  < > 101 101 100*  CO2 24  < > 28 26 29   BUN 10  < > 17 22* 17  CREATININE 1.15  < > 1.07 1.09 1.10  CALCIUM 9.4  < > 9.3 9.2 8.8*  PROT 8.2*  --   --   --   --   BILITOT 0.8  --   --   --   --   ALKPHOS 48  --   --   --   --   ALT 49  --   --   --   --   AST 27  --   --   --   --   GLUCOSE 100*  < > 150* 137* 143*  < > = values in this interval not displayed. UDS negative TSH 1.517 CRP < 0.08, Sed Rate 3  A1C 5.0 Vit D 17.8 HIV NR  ANA, B burgdorfi, Anti-Jo 1 Ab, extractable nuclear antigben ab panel: negative  Lipid Panel     Component Value Date/Time   CHOL 90 02/16/2017 0506   TRIG 146 02/16/2017 0506   HDL 22 (L) 02/16/2017 0506   CHOLHDL 4.1 02/16/2017 0506   VLDL 29 02/16/2017 0506  LDLCALC 39 02/16/2017 0506    Imaging/Diagnostic Tests: Ct Head Wo Contrast  Result Date: 02/15/2017 CLINICAL DATA:  Slurred speech EXAM: CT HEAD WITHOUT CONTRAST TECHNIQUE: Contiguous axial images were obtained from the base of the skull through the vertex without intravenous contrast. COMPARISON:  None. FINDINGS: Brain: Subtle area of low-density noted in the deep white matter in the left frontoparietal region. No hemorrhage. No hydrocephalus. No mass lesion. Vascular: No hyperdense vessel or unexpected calcification. Skull: No acute calvarial abnormality. Sinuses/Orbits: Visualized paranasal sinuses and mastoids clear. Orbital soft tissues unremarkable. Other: None IMPRESSION: Subtle low-density in the deep white matter in the left frontoparietal region. Cannot exclude acute infarct. May consider further evaluation with MRI.  Electronically Signed   By: Charlett Nose M.D.   On: 02/15/2017 16:00   Mr Laqueta Jean And Wo Contrast  Result Date: 02/15/2017 CLINICAL DATA:  RIGHT facial weakness, RIGHT hand numbness beginning 4 days ago while on vacation. EXAM: MRI HEAD WITHOUT AND WITH CONTRAST TECHNIQUE: Multiplanar, multiecho pulse sequences of the brain and surrounding structures were obtained without and with intravenous contrast. CONTRAST:  20mL MULTIHANCE GADOBENATE DIMEGLUMINE 529 MG/ML IV SOLN COMPARISON:  CT HEAD February 15, 2017 at 1556 hours FINDINGS: INTRACRANIAL CONTENTS: LEFT centrum semiovale 23 x 18 mm FLAIR T2 hyperintense lesion with marginal reduced diffusion, central T2 shine through and discontinuous marginal enhancement. Faint reduced diffusion and enhancement RIGHT cerebral peduncle 6 mm lesion. Small juxta cortical LEFT parietal lesion. At least 6 additional supratentorial white matter lesions, many of which radiate from the periventricular margin with low T1 signal compatible with black holes of demyelination. Prominent perivascular spaces. Ventricles and sulci normal for patient's age, cavum septum pellucidum. No midline shift or mass effect. No abnormal extra-axial fluid collections or abnormal extra-axial enhancement. VASCULAR: Normal major intracranial vascular flow voids present at skull base. SKULL AND UPPER CERVICAL SPINE: No abnormal sellar expansion. No suspicious calvarial bone marrow signal. Craniocervical junction maintained. SINUSES/ORBITS: The mastoid air-cells and included paranasal sinuses are well-aerated.The included ocular globes and orbital contents are non-suspicious. OTHER: None. IMPRESSION: Multiple supratentorial lesions compatible with acute on chronic demyelination including enhancing 23 x 18 mm LEFT centrum semiovale tumefactive lesion and, subcentimeter RIGHT cerebral peduncle lesion. No parenchymal brain volume loss for age. Acute findings discussed with and reconfirmed by Dr.COURTENEY MACKUEN  on 02/15/2017 at 8:30 pm. Electronically Signed   By: Awilda Metro M.D.   On: 02/15/2017 20:50   Mr Cervical Spine W Wo Contrast  Result Date: 02/17/2017 CLINICAL DATA:  36 y/o M; multiple sclerosis with right face and hand sensory numbness in conjunction with slurred speech. EXAM: MRI CERVICAL SPINE WITHOUT AND WITH CONTRAST TECHNIQUE: Multiplanar and multiecho pulse sequences of the cervical spine, to include the craniocervical junction and cervicothoracic junction, were obtained without and with intravenous contrast. CONTRAST:  20mL MULTIHANCE GADOBENATE DIMEGLUMINE 529 MG/ML IV SOLN COMPARISON:  02/15/2017 MRI of the head FINDINGS: Alignment: Straightening of cervical lordosis. Vertebrae: No fracture, evidence of discitis, or bone lesion. No abnormal enhancement. Cord: Single nonenhancing T2 hyperintense cord lesion within the central anterior cord at the C4 level. No abnormal enhancement of the cord. Posterior Fossa, vertebral arteries, paraspinal tissues: Negative. Disc levels: C2-3: Small central disc protrusion with ventral thecal sac effacement and minimal anterior cord flattening. No significant canal stenosis or foraminal narrowing. C3-4: No significant disc displacement, foraminal narrowing, or canal stenosis. C4-5: Small central disc protrusion with slight anterior cord impingement and flattening. No significant foraminal narrowing or canal stenosis. C5-6:  No significant disc displacement, foraminal narrowing, or canal stenosis. C6-7: No significant disc displacement, foraminal narrowing, or canal stenosis. C7-T1: No significant disc displacement, foraminal narrowing, or canal stenosis. IMPRESSION: Nonenhancing cord lesion within the anterior cord at the C4 level compatible with demyelination and history of multiple sclerosis. Electronically Signed   By: Mitzi Hansen M.D.   On: 02/17/2017 21:40     Tillman Sers, DO 02/19/2017, 9:26 AM PGY-1, [facility]  Intern pager: [phone], text pages welcome

## 2017-02-19 NOTE — Discharge Instructions (Signed)
Multiple Sclerosis °Multiple sclerosis (MS) is a disease of the central nervous system. It leads to the loss of the insulating covering of the nerves (myelin sheath) of your brain. When this happens, brain signals do not get sent properly or may not get sent at all. The age of onset of MS varies. °What are the causes? °The cause of MS is unknown. However, it is more common in the [place] than in the southern [place]. °What increases the risk? °There is a higher number of women with MS than men. MS is not an illness that is passed down to you from your family members (inherited). However, your risk of MS is higher if you have a relative with MS. °What are the signs or symptoms? °The symptoms of MS occur in episodes or attacks. These attacks may last weeks to months. There may be long periods of almost no symptoms between attacks. The symptoms of MS vary. This is because of the many different ways it affects the central nervous system. The main symptoms of MS include: °· Vision problems and eye pain. °· Numbness. °· Weakness. °· Inability to move your arms, hands, feet, or legs (paralysis). °· Balance problems. °· Tremors. °How is this diagnosed? °Your health care provider can diagnose MS with the help of imaging exams and lab tests. These may include specialized X-ray exams and spinal fluid tests. The best imaging exam to confirm a diagnosis of MS is an MRI. °How is this treated? °There is no known cure for MS, but there are medicines that can decrease the number and frequency of attacks. Steroids are often used for short-term relief. Physical and occupational therapy may also help. There are also many new alternative or complementary treatments available to help control the symptoms of MS. Ask your health care provider if any of these other options are right for you. °Follow these instructions at home: °· Take medicines as directed by your health care provider. °· Exercise as directed by your  health care provider. °Contact a health care provider if: °You begin to feel depressed. °Get help right away if: °· You develop paralysis. °· You have problems with bladder, bowel, or sexual function. °· You develop mental changes, such as forgetfulness or mood swings. °· You have a period of uncontrolled movements (seizure). °This information is not intended to replace advice given to you by your health care provider. Make sure you discuss any questions you have with your health care provider. °Document Released: 08/12/2000 Document Revised: 01/21/2016 Document Reviewed: 04/22/2013 °Elsevier Interactive Patient Education © 2017 [employer] ° °

## 2017-02-19 NOTE — Progress Notes (Signed)
Discharge teaching complete. Meds, diet, activity, follow up appointments reviewed and all questions answered. Copy if instructions given to patient. Patient dischahrged by foot with wife.

## 2017-02-21 ENCOUNTER — Encounter: Payer: Self-pay | Admitting: Neurology

## 2017-02-21 ENCOUNTER — Ambulatory Visit (INDEPENDENT_AMBULATORY_CARE_PROVIDER_SITE_OTHER): Payer: BLUE CROSS/BLUE SHIELD | Admitting: Neurology

## 2017-02-21 VITALS — BP 131/83 | HR 52 | Resp 16 | Ht 74.0 in | Wt 239.5 lb

## 2017-02-21 DIAGNOSIS — R4781 Slurred speech: Secondary | ICD-10-CM

## 2017-02-21 DIAGNOSIS — E559 Vitamin D deficiency, unspecified: Secondary | ICD-10-CM

## 2017-02-21 DIAGNOSIS — G35 Multiple sclerosis: Secondary | ICD-10-CM | POA: Diagnosis not present

## 2017-02-21 DIAGNOSIS — R200 Anesthesia of skin: Secondary | ICD-10-CM | POA: Diagnosis not present

## 2017-02-21 DIAGNOSIS — Z79899 Other long term (current) drug therapy: Secondary | ICD-10-CM | POA: Diagnosis not present

## 2017-02-21 MED ORDER — VITAMIN D (ERGOCALCIFEROL) 1.25 MG (50000 UNIT) PO CAPS: 50000.0000 [IU] | ORAL_CAPSULE | ORAL | 0 refills | Status: DC

## 2017-02-21 NOTE — Progress Notes (Signed)
x

## 2017-02-21 NOTE — Progress Notes (Signed)
[facility]  PATIENT: Dylan Mccoy DOB: [birth year]  REFERRING DOCTOR OR PCP:  Referred by Dr. Amada Jupiter     SOURCE: Patient, notes from his recent hospital admission, imaging lab reports, MRI images on PACS.  _________________________________   HISTORICAL  CHIEF COMPLAINT:  Chief Complaint  Patient presents with  . Multiple Sclerosis    Lipa is here with his wife Melton Alar for eval of MS.  Sts. around 02/09/17 he began having right sided facial weakness, numbess right hand, and slurred speech.  He was taken to [facility] for r/o CVA.  MRI done there is suspicious for MS.  No LP done.  He was admitted and given 5 days of IV SM.  He is here today to confirm dx. and discuss tx. options/fim  . Hx. of Seizure    Sts. he had one sz. has a young child--around age 29-4--? febrile sz.  Has never taken any sz. meds/fim    HISTORY OF PRESENT ILLNESS:  I had the pleasure seeing your patient, Dylan Mccoy, at [facility] for neurologic consultation regarding his recent diagnosis of multiple sclerosis after presenting with slurred speech and right hand numbness  On 02/10/2017, while vacationing in [place], he woke up with numbness on the right side of his face, his tongue feeling thick and having slurred speech. He also noted some mild numbness and clumsiness in the right hand. He did not note any symptoms in the leg. The left side was fine. He had no trouble with his walking. When he returned to [facility] and his symptoms persisted, he presented to the emergency room. An MRI of the brain was performed showing multiple T2/flair hyperintense lesions consistent with MS. Two foci, one larger one in the left centrum semiovale ovale and a smaller one on the right side of the midbrain enhanced consistent with more acute lesions. While in the hospital he also had an MRI of the cervical spine that showed one non-enhancing focus adjacent to C4. He received 5  days of IV steroids. By the time he got to [facility] on 02/15/2017, he was already better compared to couple days earlier and he improved further after the steroids. He currently notes just minimal symptoms in the right arm and improved problems with numbness and in keeping the mouth tightly closed. In retrospect, he has not noted any other symptoms that could represent an earlier MS exacerbation. He is very active and exercises regularly.  Gait/strength/sensation: He denies any difficulty with his gait. He does not note any more weakness in the right hand. He still notes slight weakness in the right face. His voice is still a bit slurred. He notes some numbness in the right face and tongue.  Bladder function: He denies any urinary frequency, urgency or incontinence. There is no hesitancy. He does have nocturia once some night especially if he drinks more water.  Vision: He denies any difficulty with visual acuity. He does not note any diplopia and has not had this symptoms of diplopia in the past. At times, in the past he would note some eyestrain.  Fatigue/sleep:   He has not noted any major problems with fatigue. He is sleeping well at night.        Mood/cognition: He denies any difficulties with depression or anxiety. He has not noted any difficulties with cognition.  Other: He was found to have vitamin D deficiency with a level of 17.8 while in the hospital.    I personally reviewed  the MRI of the brain and cervical spine from 02/15/2017 and 02/17/2017. The MRI of the brain shows a large enhancing lesion in the centrum semiovale ovale on the left and a small right midbrain lesion that were enhancing consistent with acute MS plaque. There were several other periventricular foci consistent with MS. The MRI of the spinal cord showed a plaque anteriorly at the C4 level.  There is no family history of seizures. Mr. Bohr had a single seizure when he was talking.  REVIEW OF  SYSTEMS: Constitutional: No fevers, chills, sweats, or change in appetite Eyes: No visual changes, double vision, eye pain Ear, nose and throat: No hearing loss, ear pain, nasal congestion, sore throat Cardiovascular: No chest pain, palpitations Respiratory: No shortness of breath at rest or with exertion.   No wheezes GastrointestinaI: No nausea, vomiting, diarrhea, abdominal pain, fecal incontinence Genitourinary: No dysuria, urinary retention or frequency.  No nocturia. Musculoskeletal: No neck pain, back pain Integumentary: No rash, pruritus, skin lesions Neurological: as above Psychiatric: No depression at this time.  No anxiety Endocrine: No palpitations, diaphoresis, change in appetite, change in weigh or increased thirst Hematologic/Lymphatic: No anemia, purpura, petechiae. Allergic/Immunologic: No itchy/runny eyes, nasal congestion, recent allergic reactions, rashes  ALLERGIES: Allergies  Allergen Reactions  . Nickel   . Other     Fragrances    HOME MEDICATIONS:  Current Outpatient Prescriptions:  .  clobetasol (OLUX) 0.05 % topical foam, Apply topically 2 (two) times daily., Disp: 50 g, Rfl: 0 .  diphenhydrAMINE (BENADRYL) 25 mg capsule, Take 25 mg by mouth every 6 (six) hours as needed for allergies., Disp: , Rfl:  .  Vitamin D, Ergocalciferol, (DRISDOL) 50000 units CAPS capsule, Take 1 capsule (50,000 Units total) by mouth every 7 (seven) days., Disp: 12 capsule, Rfl: 0  PAST MEDICAL HISTORY: Past Medical History:  Diagnosis Date  . Multiple sclerosis (HCC)   . Seizures (HCC)     PAST SURGICAL HISTORY: Past Surgical History:  Procedure Laterality Date  . FRACTURE SURGERY     R elbow, 2nd grade    FAMILY HISTORY: Family History  Problem Relation Age of Onset  . Asthma Mother   . Allergies Mother   . Urticaria Mother   . Healthy Father   . Asthma Brother   . Allergies Brother   . Diabetes Maternal Grandmother   . Diabetes Paternal Grandmother   .  Allergic rhinitis Neg Hx   . Angioedema Neg Hx   . Atopy Neg Hx   . Eczema Neg Hx   . Immunodeficiency Neg Hx     SOCIAL HISTORY:  Social History   Social History  . Marital status: Married    Spouse name: N/A  . Number of children: N/A  . Years of education: N/A   Occupational History  . Not on file.   Social History Main Topics  . Smoking status: Former Games developer  . Smokeless tobacco: Never Used  . Alcohol use No  . Drug use: No  . Sexual activity: Yes    Birth control/ protection: Pill     Comment: 1 partner last year   Other Topics Concern  . Not on file   Social History Narrative  . No narrative on file     PHYSICAL EXAM  Vitals:   02/21/17 1331  BP: 131/83  Pulse: (!) 52  Resp: 16  Weight: 239 lb 8 oz (108.6 kg)  Height: 6\' 2"  (1.88 m)    Body mass index is 30.75 kg/m.  General: The patient is well-developed and well-nourished and in no acute distress  Eyes:  Funduscopic exam shows normal optic discs and retinal vessels.  Neck: The neck is supple, no carotid bruits are noted.  The neck is nontender.  Cardiovascular: The heart has a regular rate and rhythm with a normal S1 and S2. There were no murmurs, gallops or rubs. Lungs are clear to auscultation.  Skin: Extremities are without significant edema.  Musculoskeletal:  Back is nontender  Neurologic Exam  Mental status: The patient is alert and oriented x 3 at the time of the examination. The patient has apparent normal recent and remote memory, with an apparently normal attention span and concentration ability.   Speech is normal in content.  Cranial nerves: Extraocular movements are full. Pupils are equal, round, and reactive to light and accomodation.  Visual fields are full.  Facial symmetry is present. There is good facial sensation to soft touch bilaterally.Facial strength is mildly reduced on the right.  Trapezius and sternocleidomastoid strength is normal. He has mild slurred speech.  The  tongue is midline, and the patient has symmetric elevation of the soft palate. No obvious hearing deficits are noted.  Motor:  Muscle bulk is normal.   Tone is normal. Strength is  5 / 5 in all 4 extremities.   Sensory: Sensory testing is intact to pinprick, soft touch and vibration sensation in all 4 extremities.  Coordination: Cerebellar testing reveals good finger-nose-finger and heel-to-shin bilaterally.  Gait and station: Station is normal.   Gait is normal. Tandem gait is normal. Romberg is negative.   Reflexes: Deep tendon reflexes are symmetric and normal bilaterally (2 in arms and 3 at knees).   Plantar responses are flexor.    DIAGNOSTIC DATA (LABS, IMAGING, TESTING) - I reviewed patient records, labs, notes, testing and imaging myself where available.  Lab Results  Component Value Date   WBC 17.3 (H) 02/19/2017   HGB 12.6 (L) 02/19/2017   HCT 39.9 02/19/2017   MCV 75.7 (L) 02/19/2017   PLT 167 02/19/2017      Component Value Date/Time   NA 136 02/19/2017 0335   K 3.7 02/19/2017 0335   CL 100 (L) 02/19/2017 0335   CO2 29 02/19/2017 0335   GLUCOSE 143 (H) 02/19/2017 0335   BUN 17 02/19/2017 0335   CREATININE 1.10 02/19/2017 0335   CALCIUM 8.8 (L) 02/19/2017 0335   PROT 8.2 (H) 02/15/2017 1449   ALBUMIN 4.7 02/15/2017 1449   AST 27 02/15/2017 1449   ALT 49 02/15/2017 1449   ALKPHOS 48 02/15/2017 1449   BILITOT 0.8 02/15/2017 1449   GFRNONAA >60 02/19/2017 0335   GFRAA >60 02/19/2017 0335   Lab Results  Component Value Date   CHOL 90 02/16/2017   HDL 22 (L) 02/16/2017   LDLCALC 39 02/16/2017   TRIG 146 02/16/2017   CHOLHDL 4.1 02/16/2017   Lab Results  Component Value Date   HGBA1C 5.0 02/16/2017   No results found for: VITAMINB12 Lab Results  Component Value Date   TSH 1.517 02/16/2017       ASSESSMENT AND PLAN  Multiple sclerosis (HCC) - Plan: Hepatitis B surface antigen, Hepatitis B core antibody, total, Quantiferon tb gold assay (blood),  Hepatitis B surface antibody, Stratify JCV Antibody Test ([facility])  Vitamin D deficiency  Numbness  Slurred speech  High risk medication use - Plan: Hepatitis B surface antigen, Hepatitis B core antibody, total, Quantiferon tb gold assay (blood), Hepatitis B surface antibody, Stratify JCV  Antibody Test ([facility])    In summary, Andrae Claunch is a 36 year old man who had the onset of slurred speech, facial weakness and arm numbness about 11 days ago.   The MRI of the brain is very consistent with relapsing remitting multiple sclerosis showing 2 enhancing lesions, either of which could've played a role in his symptoms. He has other chronic lesions including one in the spinal cord. Because of the acute brainstem lesion and large hemispheric lesion as well as a more chronic spinal cord lesion, I am concerned that he is presenting with a more aggressive course than typical course than typical. Therefore, I feel he should initiate a highly effective disease modifying therapy for his MS. We discussed Tysabri and ocrelizumab as two possible options and I went over the risks and benefits of both in detail. He is in agreement with starting one of these medications and we will check blood work to rule out chronic hepatitis, tuberculosis and to determine whether he is JCV antibody positive or negative. Based on the results of the studies, we will send it in a service request form for one of these 2 medications. Additionally, his vitamin D was low and I will have him start high-dose supplementation therapy and switch over to over-the-counter therapy in a couple months.  He will return for his infusion and return to see me for regular follow-up in 3 months. We went over the possible presentations of relapses and I have asked him to call us as soon as possible if he believes he is having one or if he is having other symptoms.  Thank you for asking me to see Mr. Mckillop. Please let me know positive for this assistance  with him of the patient's the future.  Lacrystal Barbe A. Epimenio Foot, MD, [facility] 02/21/2017, 9:03 PM Certified in Neurology, Clinical Neurophysiology, Sleep Medicine, Pain Medicine and Neuroimaging  [facility] [address], [address] [place], [place] [address] [phone]

## 2017-02-22 ENCOUNTER — Telehealth: Payer: Self-pay | Admitting: General Practice

## 2017-02-22 LAB — HEPATITIS B SURFACE ANTIGEN: HEP B S AG: NEGATIVE

## 2017-02-22 LAB — HEPATITIS B CORE ANTIBODY, TOTAL: Hep B Core Total Ab: NEGATIVE

## 2017-02-22 LAB — HEPATITIS B SURFACE ANTIBODY,QUALITATIVE: Hep B Surface Ab, Qual: REACTIVE

## 2017-02-22 NOTE — Telephone Encounter (Signed)
Left message on pt's home/mobile number requesting he call back to est. care here or schedule a HFU with [place]. Called work number without successful contact. - Mesha Guinyard

## 2017-02-26 LAB — QUANTIFERON IN TUBE
QUANTIFERON MITOGEN VALUE: >10 IU/mL
QUANTIFERON NIL VALUE: 0.04 [IU]/mL
QUANTIFERON TB AG VALUE: 0.03 [IU]/mL
QUANTIFERON TB GOLD: NEGATIVE

## 2017-02-26 LAB — QUANTIFERON TB GOLD ASSAY (BLOOD)

## 2017-03-03 ENCOUNTER — Telehealth: Payer: Self-pay | Admitting: *Deleted

## 2017-03-03 NOTE — Telephone Encounter (Signed)
I have spoken with Dylan Mccoy this afternoon and advised, that per RAS, lab results show he is a candidate for either Ocrevus or Tysabri.  RAS rec. he start Tysabri.  Lengthy, pleasant conversation.  Many questions answered, related to Tysabri, PML, ins. coverage.  He will discuss with his family and call back Monday with a decision/fim

## 2017-03-06 NOTE — Telephone Encounter (Signed)
I have spoken with Dylan Mccoy this afternoon.  He sts. he has decided he would like to start Tysabri.  Tysabri srf completed and faxed to [employer]/fim

## 2017-03-06 NOTE — Telephone Encounter (Signed)
Correction: Tysabri srf completed and given to [facility] in the infusion suite/fim

## 2017-03-06 NOTE — Telephone Encounter (Signed)
Patient called office in reference to discussing with his family that he is going to process with Tysabri.  Please call

## 2017-03-08 MED ORDER — OXYCODONE-ACETAMINOPHEN 5-325 MG PO TABS
ORAL_TABLET | ORAL | Status: AC
  Filled 2017-03-08: qty 1

## 2017-05-26 ENCOUNTER — Ambulatory Visit (INDEPENDENT_AMBULATORY_CARE_PROVIDER_SITE_OTHER): Payer: BLUE CROSS/BLUE SHIELD | Admitting: Neurology

## 2017-05-26 ENCOUNTER — Encounter (INDEPENDENT_AMBULATORY_CARE_PROVIDER_SITE_OTHER): Payer: Self-pay

## 2017-05-26 ENCOUNTER — Encounter: Payer: Self-pay | Admitting: Neurology

## 2017-05-26 VITALS — BP 118/69 | HR 54 | Resp 16 | Ht 74.0 in | Wt 229.0 lb

## 2017-05-26 DIAGNOSIS — G35 Multiple sclerosis: Secondary | ICD-10-CM | POA: Diagnosis not present

## 2017-05-26 DIAGNOSIS — R200 Anesthesia of skin: Secondary | ICD-10-CM | POA: Diagnosis not present

## 2017-05-26 DIAGNOSIS — Z79899 Other long term (current) drug therapy: Secondary | ICD-10-CM | POA: Diagnosis not present

## 2017-05-26 NOTE — Progress Notes (Signed)
[facility]  PATIENT: Dylan Mccoy DOB: 04-12-1981  REFERRING DOCTOR OR PCP:  Referred by Dr. Amada Jupiter     SOURCE: Patient, notes from his recent hospital admission, imaging lab reports, MRI images on PACS.  _________________________________   HISTORICAL  CHIEF COMPLAINT:  Chief Complaint  Patient presents with  . Multiple Sclerosis    Sts. he is tolerating Tysabri well--has had 2 infusions (04/06/17 and 05/04/17) and 3rd infusion is pending 06/01/17.  Sts. he is doing great, and "feel back to my old self."/fim    HISTORY OF PRESENT ILLNESS:  I had the pleasure seeing your patient, Dylan Mccoy, at [facility] for neurologic consultation regarding his recent diagnosis of multiple sclerosis after presenting with slurred speech and right hand numbness  Update 05/26/2017:   After his last visit 3 months ago, he was started on natalizumab. He is JCV antibody negative and he tolerates it well.    He has not had further exacerbations since starting.  He denies any gait issues, numbness and weakness and is back working out in the gym.  The right facial numbness, slurred speech and clumsiness have completely resolved.  He denies fatigue.   His sleep is unchanged with mild insomnia some nights.  He denies any difficulty with mood or cognition.  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 He also noted some mild numbness and clumsiness in the right hand. He did not note any symptoms in the leg. The left side was fine. He had no trouble with his walking. When he returned to [facility] and his symptoms persisted, he presented to the emergency room. An MRI of the brain was performed showing multiple T2/flair hyperintense lesions  consistent with MS. Two foci, one larger one in the left centrum semiovale ovale and a smaller one on the right side of the midbrain enhanced consistent with more acute lesions. While in the hospital he also had an MRI of the cervical spine that showed one non-enhancing focus adjacent to C4. He received 5 days of IV steroids. By the time he got to [facility] on 02/15/2017, he was already better compared to couple days earlier and he improved further after the steroids. He currently notes just minimal symptoms in the right arm and improved problems with numbness and in keeping the mouth tightly closed. In retrospect, he has not noted any other symptoms that could represent an earlier MS exacerbation. He is very active and exercises regularly.  Gait/strength/sensation: He denies any difficulty with his gait. He does not note any more weakness in the right hand. He still notes slight weakness in the right face. His voice is still a bit slurred. He notes some numbness in the right face and tongue.  Bladder function: He denies any urinary frequency, urgency or incontinence. There is no hesitancy. He does have nocturia once some night especially if he drinks more water.  Vision: He denies any difficulty with visual acuity. He does not note any diplopia and has not had this symptoms of diplopia in the past. At times, in the past he would note some eyestrain.  Fatigue/sleep:   He has not noted any major problems with fatigue. He is sleeping well at night.        Mood/cognition: He denies any  difficulties with depression or anxiety. He has not noted any difficulties with cognition.  Other: He was found to have vitamin D deficiency with a level of 17.8 while in the hospital.    I personally reviewed the MRI of the brain and cervical spine from 02/15/2017 and 02/17/2017. The MRI of the brain shows a large enhancing lesion in the centrum semiovale ovale on the left and a small right midbrain lesion that were  enhancing consistent with acute MS plaque. There were several other periventricular foci consistent with MS. The MRI of the spinal cord showed a plaque anteriorly at the C4 level.  There is no family history of seizures. Mr. Cull had a single seizure when he was talking.  REVIEW OF SYSTEMS: Constitutional: No fevers, chills, sweats, or change in appetite Eyes: No visual changes, double vision, eye pain Ear, nose and throat: No hearing loss, ear pain, nasal congestion, sore throat Cardiovascular: No chest pain, palpitations Respiratory: No shortness of breath at rest or with exertion.   No wheezes GastrointestinaI: No nausea, vomiting, diarrhea, abdominal pain, fecal incontinence Genitourinary: No dysuria, urinary retention or frequency.  No nocturia. Musculoskeletal: No neck pain, back pain Integumentary: No rash, pruritus, skin lesions Neurological: as above Psychiatric: No depression at this time.  No anxiety Endocrine: No palpitations, diaphoresis, change in appetite, change in weigh or increased thirst Hematologic/Lymphatic: No anemia, purpura, petechiae. Allergic/Immunologic: No itchy/runny eyes, nasal congestion, recent allergic reactions, rashes  ALLERGIES: Allergies  Allergen Reactions  . Nickel   . Other     Fragrances    HOME MEDICATIONS:  Current Outpatient Prescriptions:  .  clobetasol (OLUX) 0.05 % topical foam, Apply topically 2 (two) times daily., Disp: 50 g, Rfl: 0 .  natalizumab (TYSABRI) 300 MG/15ML injection, Inject 300 mg into the vein every 30 (thirty) days., Disp: , Rfl:  .  Vitamin D, Ergocalciferol, (DRISDOL) 50000 units CAPS capsule, Take 1 capsule (50,000 Units total) by mouth every 7 (seven) days., Disp: 12 capsule, Rfl: 0  PAST MEDICAL HISTORY: Past Medical History:  Diagnosis Date  . Multiple sclerosis (HCC)   . Seizures (HCC)     PAST SURGICAL HISTORY: Past Surgical History:  Procedure Laterality Date  . FRACTURE SURGERY     R elbow,  2nd grade    FAMILY HISTORY: Family History  Problem Relation Age of Onset  . Asthma Mother   . Allergies Mother   . Urticaria Mother   . Healthy Father   . Asthma Brother   . Allergies Brother   . Diabetes Maternal Grandmother   . Diabetes Paternal Grandmother   . Allergic rhinitis Neg Hx   . Angioedema Neg Hx   . Atopy Neg Hx   . Eczema Neg Hx   . Immunodeficiency Neg Hx     SOCIAL HISTORY:  Social History   Social History  . Marital status: Married    Spouse name: N/A  . Number of children: N/A  . Years of education: N/A   Occupational History  . Not on file.   Social History Main Topics  . Smoking status: Former Games developer  . Smokeless tobacco: Never Used  . Alcohol use No  . Drug use: No  . Sexual activity: Yes    Birth control/ protection: Pill     Comment: 1 partner last year   Other Topics Concern  . Not on file   Social History Narrative  . No narrative on file     PHYSICAL EXAM  Vitals:  05/26/17 1001  BP: 118/69  Pulse: (!) 54  Resp: 16  Weight: 229 lb (103.9 kg)  Height:  (1.88 m)    Body mass index is 29.4 kg/m.   General: The patient is well-developed and well-nourished and in no acute distress  Neurologic Exam  Mental status: The patient is alert and oriented x 3 at the time of the examination. The patient has apparent normal recent and remote memory, with an apparently normal attention span and concentration ability.   Speech is normal in content.  Cranial nerves: Extraocular movements are full. Pupils are reactive and equal. Facial strength and sensation is normal. Trapezius strength is normal. Slurred speech has resolved.  The tongue is midline, and the patient has symmetric elevation of the soft palate. No obvious hearing deficits are noted.  Motor:  Muscle bulk is normal.   Tone is normal. Strength is  5 / 5 in all 4 extremities.   Sensory: Sensory testing is intact to pinprick, soft touch and vibration sensation in all  4 extremities.  Coordination: Cerebellar testing reveals good finger-nose-finger and heel-to-shin bilaterally.  Gait and station: Station is normal.   Gait is normal. Tandem gait is normal. Romberg is negative.   Reflexes: Deep tendon reflexes are symmetric and normal bilaterally in the arms. Knee reflexes are mildly increased. There is no ankle clonus.    DIAGNOSTIC DATA (LABS, IMAGING, TESTING) - I reviewed patient records, labs, notes, testing and imaging myself where available.  Lab Results  Component Value Date   WBC 17.3 (H) 02/19/2017   HGB 12.6 (L) 02/19/2017   HCT 39.9 02/19/2017   MCV 75.7 (L) 02/19/2017   PLT 167 02/19/2017      Component Value Date/Time   NA 136 02/19/2017 0335   K 3.7 02/19/2017 0335   CL 100 (L) 02/19/2017 0335   CO2 29 02/19/2017 0335   GLUCOSE 143 (H) 02/19/2017 0335   BUN 17 02/19/2017 0335   CREATININE 1.10 02/19/2017 0335   CALCIUM 8.8 (L) 02/19/2017 0335   PROT 8.2 (H) 02/15/2017 1449   ALBUMIN 4.7 02/15/2017 1449   AST 27 02/15/2017 1449   ALT 49 02/15/2017 1449   ALKPHOS 48 02/15/2017 1449   BILITOT 0.8 02/15/2017 1449   GFRNONAA >60 02/19/2017 0335   GFRAA >60 02/19/2017 0335   Lab Results  Component Value Date   CHOL 90 02/16/2017   HDL 22 (L) 02/16/2017   LDLCALC 39 02/16/2017   TRIG 146 02/16/2017   CHOLHDL 4.1 02/16/2017   Lab Results  Component Value Date   HGBA1C 5.0 02/16/2017   No results found for: VHQIONGE95 Lab Results  Component Value Date   TSH 1.517 02/16/2017       ASSESSMENT AND PLAN  Multiple sclerosis (HCC)  High risk medication use  Numbness   1.   Continue Tysabri. 2.   We discussed continuing to take vitamin D for his low level. He can change to over-the-counter pills 5000 units daily. 3.   He should continue to be active and exercises as tolerated. Return in 4-5 months or sooner if there are new or worsening neurologic symptoms.  Kiannah Grunow A. Epimenio Foot, MD, [facility] 05/26/2017, 10:29  AM Certified in Neurology, Clinical Neurophysiology, Sleep Medicine, Pain Medicine and Neuroimaging  [facility] [address] [place], [place] [address] [phone]

## 2017-06-19 ENCOUNTER — Other Ambulatory Visit: Payer: Self-pay | Admitting: Allergy and Immunology

## 2017-06-19 DIAGNOSIS — L309 Dermatitis, unspecified: Secondary | ICD-10-CM

## 2017-06-19 MED ORDER — CLOBETASOL PROPIONATE 0.05 % EX FOAM: Freq: Two times a day (BID) | CUTANEOUS | 1 refills | Status: DC

## 2017-06-19 NOTE — Telephone Encounter (Signed)
Received fax for 90 day supply for Clobetasol. Patient was last seen on 12/26/2016. Request sent it.

## 2017-09-11 ENCOUNTER — Encounter: Payer: Self-pay | Admitting: *Deleted

## 2017-10-26 ENCOUNTER — Ambulatory Visit: Payer: BLUE CROSS/BLUE SHIELD | Admitting: Neurology

## 2017-11-07 ENCOUNTER — Ambulatory Visit: Payer: BLUE CROSS/BLUE SHIELD | Admitting: Neurology

## 2017-11-07 ENCOUNTER — Encounter: Payer: Self-pay | Admitting: Neurology

## 2017-11-07 ENCOUNTER — Encounter (INDEPENDENT_AMBULATORY_CARE_PROVIDER_SITE_OTHER): Payer: Self-pay

## 2017-11-07 ENCOUNTER — Other Ambulatory Visit: Payer: Self-pay

## 2017-11-07 VITALS — BP 100/68 | HR 58 | Resp 14 | Ht 74.0 in | Wt 232.0 lb

## 2017-11-07 DIAGNOSIS — R4781 Slurred speech: Secondary | ICD-10-CM | POA: Diagnosis not present

## 2017-11-07 DIAGNOSIS — G35 Multiple sclerosis: Secondary | ICD-10-CM

## 2017-11-07 DIAGNOSIS — R200 Anesthesia of skin: Secondary | ICD-10-CM | POA: Diagnosis not present

## 2017-11-07 DIAGNOSIS — Z79899 Other long term (current) drug therapy: Secondary | ICD-10-CM

## 2017-11-07 NOTE — Progress Notes (Signed)
[facility]  PATIENT: Dylan Mccoy DOB: 1981/08/16  REFERRING DOCTOR OR PCP:  Referred by Dr. Amada Jupiter     SOURCE: Patient, notes from his recent hospital admission, imaging lab reports, MRI images on PACS.  _________________________________   HISTORICAL  CHIEF COMPLAINT:  Chief Complaint  Patient presents with  . Multiple Sclerosis    Sts. he continues to tolerate Tysabri well.  Denies new or worsening sx.  JCV ab last checked 02/21/18 and was Negative at 0.18/fim    HISTORY OF PRESENT ILLNESS:  I had the pleasure seeing your patient, Fredrick Geoghegan, at [facility] for neurologic consultation regarding his recent diagnosis of multiple sclerosis after presenting with slurred speech and right hand numbness  Update 11/07/2017:   He has been on Tysabri since about June 2018. He tolerates it well and has not had any exacerbations. His JCV antibody was negative last tested 02/21/2018.  The major symptoms from his multiple sclerosis. Specifically she does not note any problems with his gait. There is no numbness or weakness. He works out periodically. At presentation he had right facial numbness and slurred speech but those resolved.   There is no difficulty with vision.   Bladder function is fine. . There is no depression or cognitive difficulties.   He notes fatigue but works out regularly and plays basketball.  He is expecting his first child over the summer.   Update 05/26/2017:   After his last visit 3 months ago, he was started on natalizumab. He is JCV antibody negative and he tolerates it well.    He has not had further exacerbations since starting.  He denies any gait issues, numbness and weakness and is back working out in the gym.  The right facial numbness, slurred speech and clumsiness have completely resolved.  He denies fatigue.   His sleep is unchanged with mild insomnia some nights.  He denies any difficulty with mood or  cognition.  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 He also noted some mild numbness and clumsiness in the right hand. He did not note any symptoms in the leg. The left side was fine. He had no trouble with his walking. When he returned to [facility] and his symptoms persisted, he presented to the emergency room. An MRI of the brain was performed showing multiple T2/flair hyperintense lesions consistent with MS. Two foci, one larger one in the left centrum semiovale ovale and a smaller one on the right side of the midbrain enhanced consistent with more acute lesions. While in the hospital he also had an MRI of the cervical spine that showed one non-enhancing focus adjacent to C4. He received 5 days of IV steroids. By the time he got to [facility] on 02/15/2017, he was already better compared to couple days earlier and he improved further after the steroids. He currently notes just minimal symptoms in the right arm and improved problems with numbness and in keeping the mouth tightly closed. In retrospect, he has not noted any other symptoms that could represent an earlier MS exacerbation. He is very active and exercises regularly.  Gait/strength/sensation: He denies any difficulty with his gait. He does not note any more weakness in the right hand. He still notes slight weakness in the right face.  His voice is still a bit slurred. He notes some numbness in the right face and tongue.  Bladder function: He denies any urinary frequency, urgency or incontinence. There is no hesitancy. He does have nocturia once some night especially if he drinks more water.  Vision: He denies any difficulty with visual acuity. He does not note any diplopia and has not had this symptoms of diplopia  in the past. At times, in the past he would note some eyestrain.  Fatigue/sleep:   He has not noted any major problems with fatigue. He is sleeping well at night.        Mood/cognition: He denies any difficulties with depression or anxiety. He has not noted any difficulties with cognition.  Other: He was found to have vitamin D deficiency with a level of 17.8 while in the hospital.    I personally reviewed the MRI of the brain and cervical spine from 02/15/2017 and 02/17/2017. The MRI of the brain shows a large enhancing lesion in the centrum semiovale ovale on the left and a small right midbrain lesion that were enhancing consistent with acute MS plaque. There were several other periventricular foci consistent with MS. The MRI of the spinal cord showed a plaque anteriorly at the C4 level.  There is no family history of seizures. Mr. Molyneux had a single seizure when he was talking.  REVIEW OF SYSTEMS: Constitutional: No fevers, chills, sweats, or change in appetite Eyes: No visual changes, double vision, eye pain Ear, nose and throat: No hearing loss, ear pain, nasal congestion, sore throat Cardiovascular: No chest pain, palpitations Respiratory: No shortness of breath at rest or with exertion.   No wheezes GastrointestinaI: No nausea, vomiting, diarrhea, abdominal pain, fecal incontinence Genitourinary: No dysuria, urinary retention or frequency.  No nocturia. Musculoskeletal: No neck pain, back pain Integumentary: No rash, pruritus, skin lesions Neurological: as above Psychiatric: No depression at this time.  No anxiety Endocrine: No palpitations, diaphoresis, change in appetite, change in weigh or increased thirst Hematologic/Lymphatic: No anemia, purpura, petechiae. Allergic/Immunologic: No itchy/runny eyes, nasal congestion, recent allergic reactions, rashes  ALLERGIES: Allergies  Allergen Reactions  . Nickel   . Other     Fragrances    HOME MEDICATIONS:  Current  Outpatient Medications:  .  clobetasol (OLUX) 0.05 % topical foam, Apply topically 2 (two) times daily., Disp: 150 g, Rfl: 1 .  natalizumab (TYSABRI) 300 MG/15ML injection, Inject 300 mg into the vein every 30 (thirty) days., Disp: , Rfl:  .  Vitamin D, Ergocalciferol, (DRISDOL) 50000 units CAPS capsule, Take 1 capsule (50,000 Units total) by mouth every 7 (seven) days. (Patient not taking: Reported on 11/07/2017), Disp: 12 capsule, Rfl: 0  PAST MEDICAL HISTORY: Past Medical History:  Diagnosis Date  . Multiple sclerosis (HCC)   . Seizures (HCC)     PAST SURGICAL HISTORY: Past Surgical History:  Procedure Laterality Date  . FRACTURE SURGERY     R elbow, 2nd grade    FAMILY HISTORY: Family History  Problem Relation Age of Onset  . Asthma Mother   . Allergies Mother   . Urticaria Mother   . Healthy Father   . Asthma Brother   . Allergies Brother   . Diabetes Maternal Grandmother   . Diabetes Paternal Grandmother   . Allergic rhinitis Neg Hx   . Angioedema Neg Hx   . Atopy Neg Hx   . Eczema Neg Hx   . Immunodeficiency Neg Hx     SOCIAL HISTORY:  Social  History   Socioeconomic History  . Marital status: Married    Spouse name: Not on file  . Number of children: Not on file  . Years of education: Not on file  . Highest education level: Not on file  Social Needs  . Financial resource strain: Not on file  . Food insecurity - worry: Not on file  . Food insecurity - inability: Not on file  . Transportation needs - medical: Not on file  . Transportation needs - non-medical: Not on file  Occupational History  . Not on file  Tobacco Use  . Smoking status: Former Games developer  . Smokeless tobacco: Never Used  Substance and Sexual Activity  . Alcohol use: No  . Drug use: No  . Sexual activity: Yes    Birth control/protection: Pill    Comment: 1 partner last year  Other Topics Concern  . Not on file  Social History Narrative  . Not on file     PHYSICAL EXAM  Vitals:    11/07/17 1508  BP: 100/68  Pulse: (!) 58  Resp: 14  Weight: 232 lb (105.2 kg)  Height: 6\' 2"  (1.88 m)    Body mass index is 29.79 kg/m.   General: The patient is well-developed and well-nourished and in no acute distress  Neurologic Exam  Mental status: The patient is alert and oriented x 3 at the time of the examination. The patient has apparent normal recent and remote memory, with an apparently normal attention span and concentration ability.   Speech is normal in content.  Cranial nerves: Extraocular movements are full. Pupils are reactive and equal. Facial strength and sensation is normal. Trapezius strength is normal. There is no slurred speech..  The tongue is midline, and the patient has symmetric elevation of the soft palate. No obvious hearing deficits are noted.  Motor:  Muscle bulk is normal.   Tone is normal. Strength is  5 / 5 in all 4 extremities.   Sensory: he has intact touch, temperature and vibration sensation in the arms and legs.remities.  Coordination: Cerebellar testing reveals good finger-nose-finger and heel-to-shin bilaterally.  Gait and station: Station is normal.   The gait is normal. He has a normal tandem gait. The Romberg is negative..   Reflexes: Deep tendon reflexes are symmetric and normal bilaterally in the arms. Knee reflexes are mildly increased. There is no ankle clonus or spread at knees.    DIAGNOSTIC DATA (LABS, IMAGING, TESTING) - I reviewed patient records, labs, notes, testing and imaging myself where available.  Lab Results  Component Value Date   WBC 17.3 (H) 02/19/2017   HGB 12.6 (L) 02/19/2017   HCT 39.9 02/19/2017   MCV 75.7 (L) 02/19/2017   PLT 167 02/19/2017      Component Value Date/Time   NA 136 02/19/2017 0335   K 3.7 02/19/2017 0335   CL 100 (L) 02/19/2017 0335   CO2 29 02/19/2017 0335   GLUCOSE 143 (H) 02/19/2017 0335   BUN 17 02/19/2017 0335   CREATININE 1.10 02/19/2017 0335   CALCIUM 8.8 (L) 02/19/2017  0335   PROT 8.2 (H) 02/15/2017 1449   ALBUMIN 4.7 02/15/2017 1449   AST 27 02/15/2017 1449   ALT 49 02/15/2017 1449   ALKPHOS 48 02/15/2017 1449   BILITOT 0.8 02/15/2017 1449   GFRNONAA >60 02/19/2017 0335   GFRAA >60 02/19/2017 0335   Lab Results  Component Value Date   CHOL 90 02/16/2017   HDL 22 (L) 02/16/2017  LDLCALC 39 02/16/2017   TRIG 146 02/16/2017   CHOLHDL 4.1 02/16/2017   Lab Results  Component Value Date   HGBA1C 5.0 02/16/2017   No results found for: VITAMINB12 Lab Results  Component Value Date   TSH 1.517 02/16/2017       ASSESSMENT AND PLAN  Multiple sclerosis (HCC) - Plan: Stratify JCV Antibody Test ([facility]), CBC with Differential/Platelet, MR BRAIN W WO CONTRAST  High risk medication use - Plan: Stratify JCV Antibody Test ([facility]), CBC with Differential/Platelet  Numbness  Slurred speech   1.   Continue Tysabri.   E will check aJCV antibody again. I discussed with him that if he converted from negative to middle or high positive then I would want him to consider switching to a different medication.   We will check an MRI of the brain to determine if he is having any subclinical progression. If present, consider a different disease modifying therapy. 2.   Continue Vit D 5000 units daily. 3.   He should continue to be active and exercises as tolerated. Return in 6 months or sooner if there are new or worsening neurologic symptoms.  Richard A. Epimenio Foot, MD, [facility] 11/07/2017, 3:51 PM Certified in Neurology, Clinical Neurophysiology, Sleep Medicine, Pain Medicine and Neuroimaging  [facility] [address], [address] [place], [place] [address] [phone]

## 2017-11-09 ENCOUNTER — Telehealth: Payer: Self-pay | Admitting: Neurology

## 2017-11-09 NOTE — Telephone Encounter (Signed)
BCBS Auth: NPR Ref # [id number]. He is scheduled for 11/21/17 at the [facility] mobile unit.

## 2017-11-14 ENCOUNTER — Encounter: Payer: Self-pay | Admitting: *Deleted

## 2017-11-21 ENCOUNTER — Ambulatory Visit: Payer: BLUE CROSS/BLUE SHIELD

## 2017-11-21 DIAGNOSIS — G35 Multiple sclerosis: Secondary | ICD-10-CM

## 2017-11-21 MED ORDER — GADOPENTETATE DIMEGLUMINE 469.01 MG/ML IV SOLN
20.0000 mL | Freq: Once | INTRAVENOUS | Status: AC | PRN
  Administered 2017-11-21: 20 mL via INTRAVENOUS

## 2017-11-22 ENCOUNTER — Telehealth: Payer: Self-pay | Admitting: *Deleted

## 2017-11-22 NOTE — Telephone Encounter (Signed)
-----   Message from Asa Lente, MD sent at 11/21/2017  6:51 PM EDT ----- Please let him know that the MRI of the brain does not show any new MS lesions.

## 2017-11-22 NOTE — Telephone Encounter (Signed)
LMOM with below MRI report/fim

## 2018-03-05 ENCOUNTER — Encounter: Payer: Self-pay | Admitting: *Deleted

## 2018-03-11 IMAGING — MR MR CERVICAL SPINE WO/W CM
4 of 8 series · 19 of 48 positions shown · IV contrast (multihance)
Comparison: 02/15/2017 MRI of the head

CLINICAL DATA: 35 y/o M; multiple sclerosis with right face and
hand sensory numbness in conjunction with slurred speech.

EXAM:
MRI CERVICAL SPINE WITHOUT AND WITH CONTRAST
TECHNIQUE: Multiplanar and multiecho pulse sequences of the cervical spine, to
include the craniocervical junction and cervicothoracic junction,
were obtained without and with intravenous contrast.
CONTRAST:  20mL MULTIHANCE GADOBENATE DIMEGLUMINE 529 MG/ML IV SOLN

[Series 2: T2 · sagittal · 3.0mm · 0.43mm/px · 4 of 17 slices shown (1 of 2)]
[im 1/17]
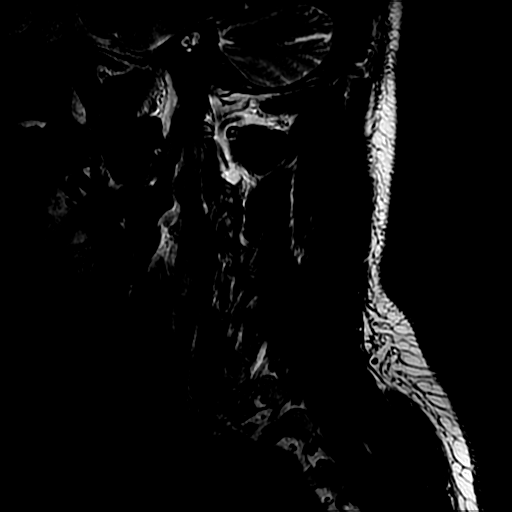
[im 6/17]
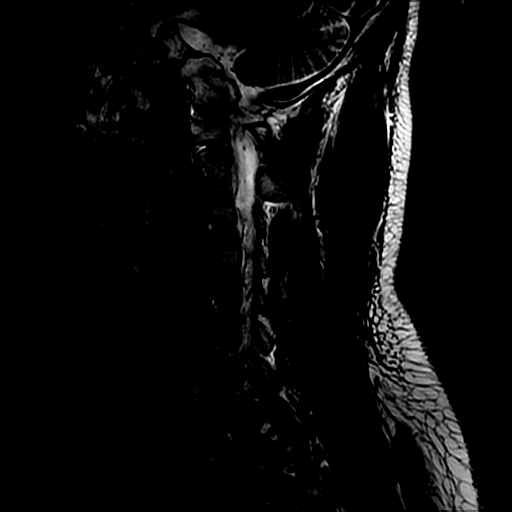
[im 11/17]
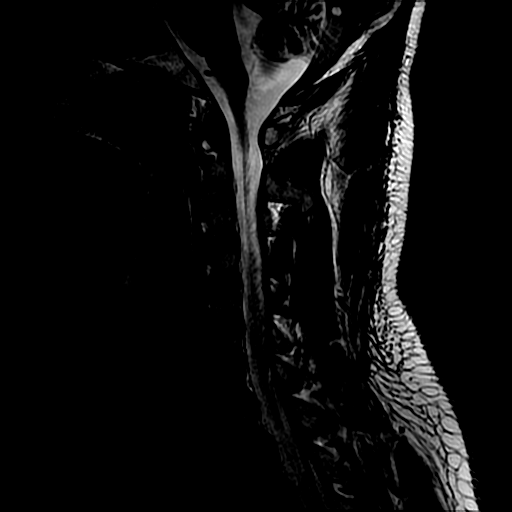
[im 17/17]
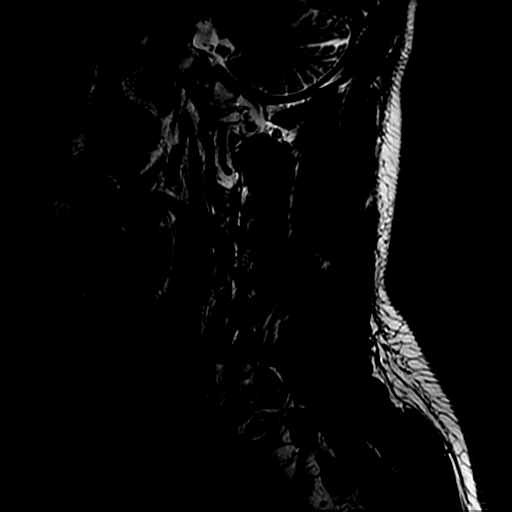

[Series 3: T1 · sagittal · 3.0mm · 0.43mm/px · 4 of 17 slices shown (1 of 2)]
[im 1/17]
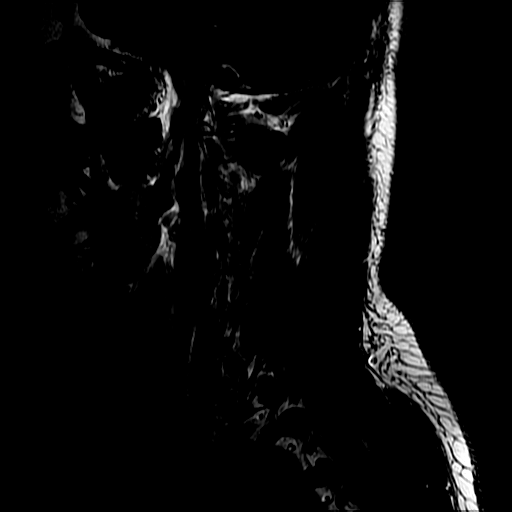
[im 6/17]
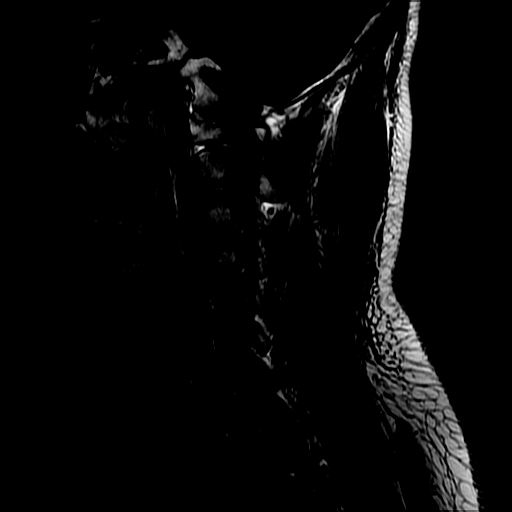
[im 11/17]
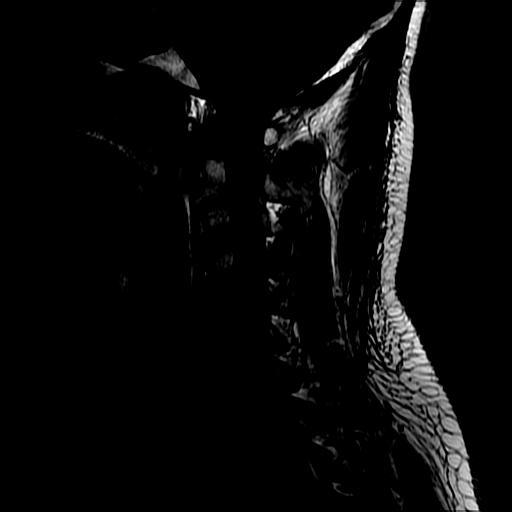
[im 17/17]
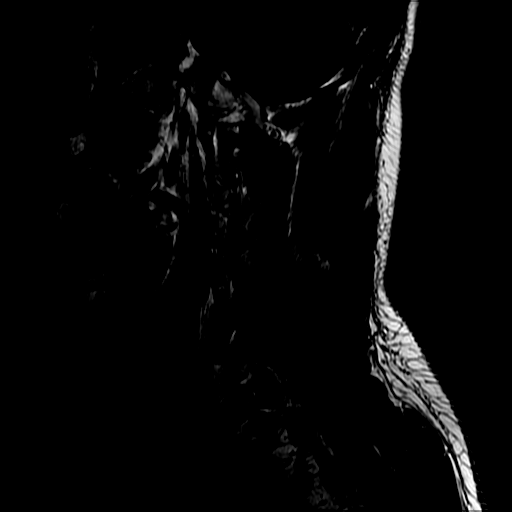

[Series 6: T2 · axial · 3.0mm · 0.39mm/px · z∈[-92,+30]mm · 8 of 36 slices shown (2 of 2)]
[im 1/36]
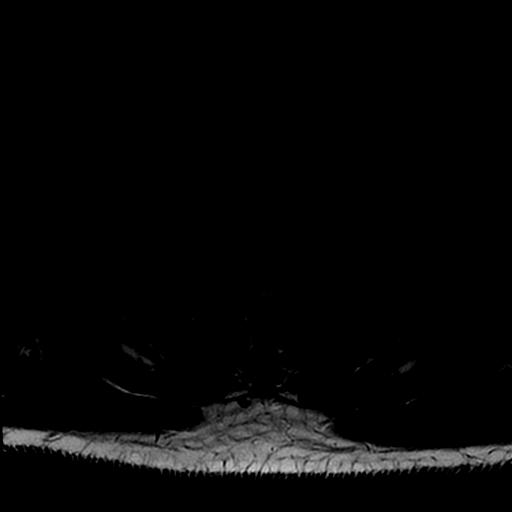
[im 6/36]
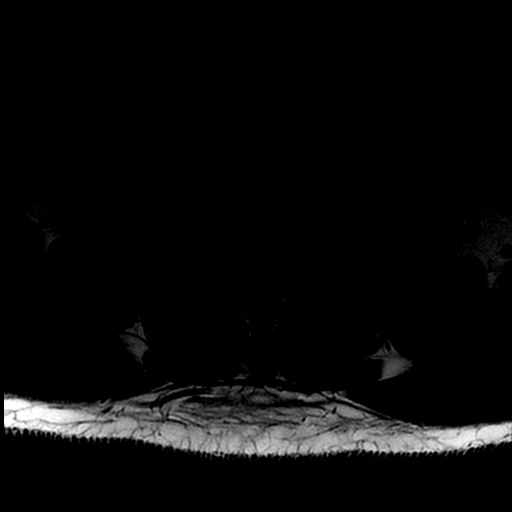
[im 11/36]
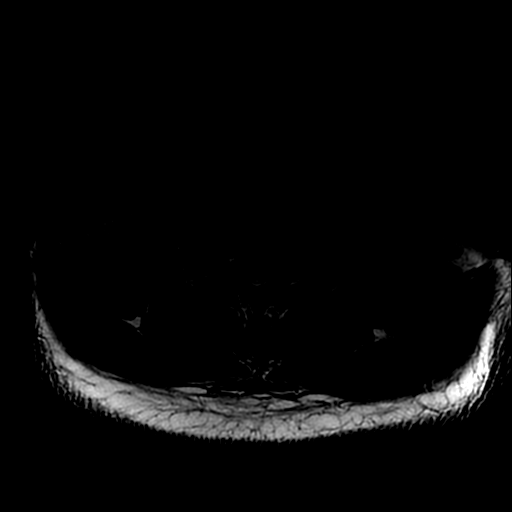
[im 16/36]
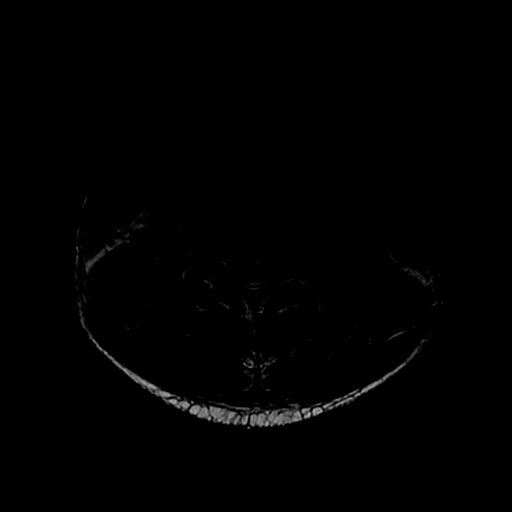
[im 21/36]
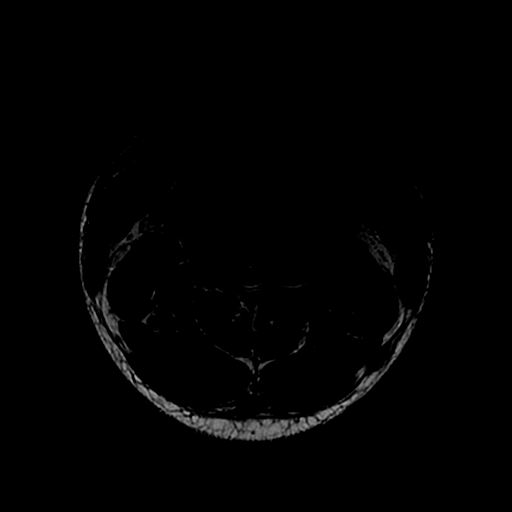
[im 26/36]
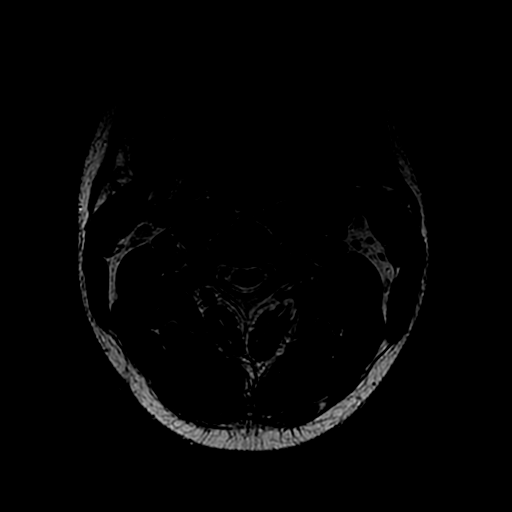
[im 31/36]
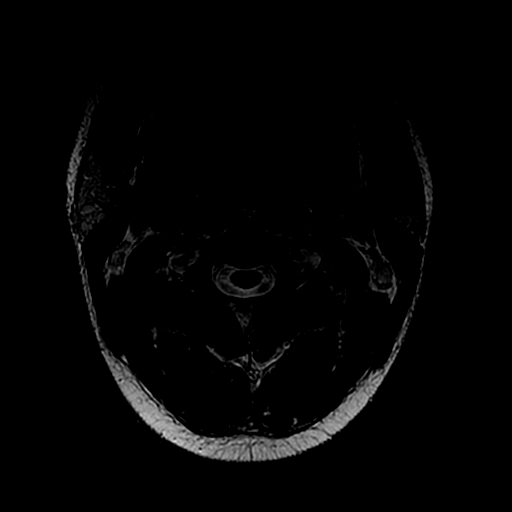
[im 36/36]
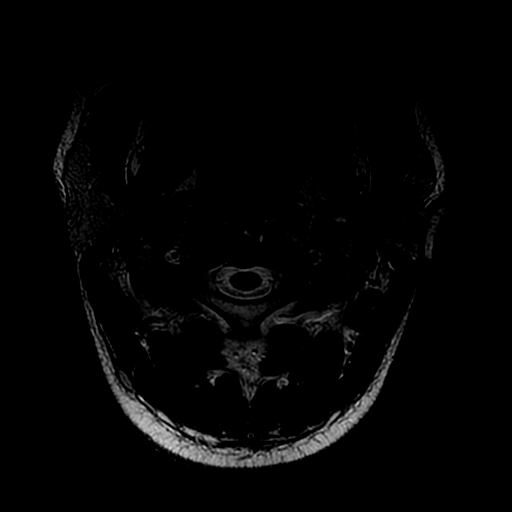

[Series 7: T1 · axial · non-contrast · 3.0mm · 0.39mm/px · z∈[-74,+13]mm · 3 of 36 slices shown (2 of 2)]
[im 6/36]
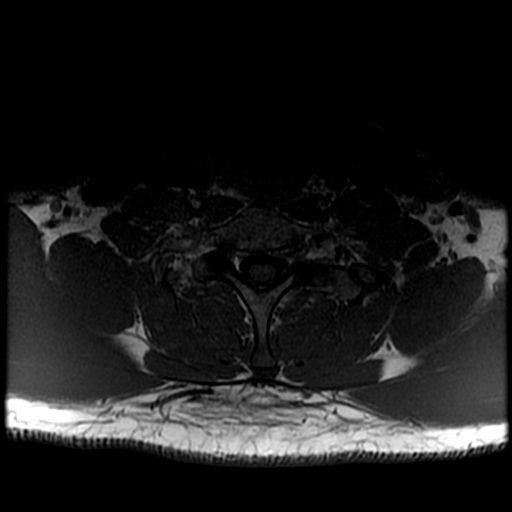
[im 21/36]
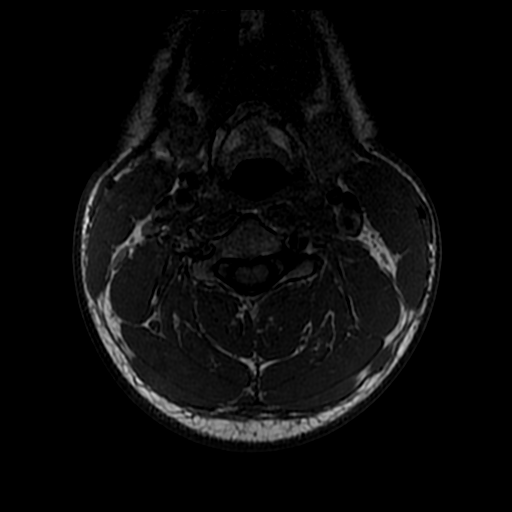
[im 31/36]
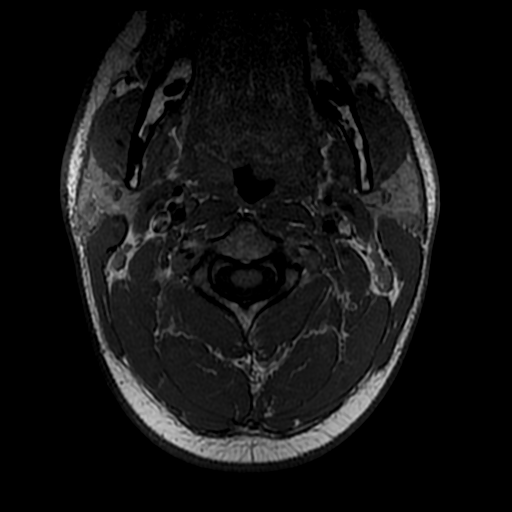

[19 of 48 positions shown; findings below may reference images not displayed]

FINDINGS: Alignment: Straightening of cervical lordosis.

Vertebrae: No fracture, evidence of discitis, or bone lesion. No
abnormal enhancement.

Cord: Single nonenhancing T2 hyperintense cord lesion within the
central anterior cord at the C4 level. No abnormal enhancement of
the cord.

Posterior Fossa, vertebral arteries, paraspinal tissues: Negative.

Disc levels:

C2-3: Small central disc protrusion with ventral thecal sac
effacement and minimal anterior cord flattening. No significant
canal stenosis or foraminal narrowing.

C3-4: No significant disc displacement, foraminal narrowing, or
canal stenosis.

C4-5: Small central disc protrusion with slight anterior cord
impingement and flattening. No significant foraminal narrowing or
canal stenosis.

C5-6: No significant disc displacement, foraminal narrowing, or
canal stenosis.

C6-7: No significant disc displacement, foraminal narrowing, or
canal stenosis.

C7-T1: No significant disc displacement, foraminal narrowing, or
canal stenosis.
IMPRESSION: Nonenhancing cord lesion within the anterior cord at the C4 level
compatible with demyelination and history of multiple sclerosis.

By: Angell Sosa M.D.

## 2018-05-10 ENCOUNTER — Ambulatory Visit: Payer: BLUE CROSS/BLUE SHIELD | Admitting: Neurology

## 2018-05-10 ENCOUNTER — Other Ambulatory Visit: Payer: Self-pay

## 2018-05-10 ENCOUNTER — Encounter: Payer: Self-pay | Admitting: Neurology

## 2018-05-10 DIAGNOSIS — G35 Multiple sclerosis: Secondary | ICD-10-CM

## 2018-05-10 DIAGNOSIS — Z79899 Other long term (current) drug therapy: Secondary | ICD-10-CM

## 2018-05-10 DIAGNOSIS — R200 Anesthesia of skin: Secondary | ICD-10-CM

## 2018-05-10 DIAGNOSIS — E559 Vitamin D deficiency, unspecified: Secondary | ICD-10-CM

## 2018-05-10 NOTE — Progress Notes (Signed)
[facility]  PATIENT: Dylan Mccoy DOB: 07-24-1981  REFERRING DOCTOR OR PCP:  Referred by Dr. Amada Jupiter     SOURCE: Patient, notes from his recent hospital admission, imaging lab reports, MRI images on PACS.  _________________________________   HISTORICAL  CHIEF COMPLAINT:  Chief Complaint  Patient presents with  . Multiple Sclerosis    Sts. he continues to tolerate Tysabri well.  JCV ab last checked 11/07/17 was Indeterminate at 0.30.  Inhibition assay was Negative/fim    HISTORY OF PRESENT ILLNESS:  Dylan Mccoy is a 37 y.o. man with relapsing remitting multiple sclerosis diagnosed in June 2018  Update 05/10/2018:     He has been on Tysabri for over a year.  He has not had any exacerbations.  He tolerates it well.  The JCV antibody titer was 0.3 (negative on inhibition assay).  MRI of the brain March 2019 showed no acute findings.  Several of the foci that enhanced on the previous brain MRI were smaller.  There were no enhancing lesions.  He is doing very well with gait and can climb stairs and ladders.   He plays basketball and does well but notes if he overheats, he feels weaker and off balanced some.     He stays well hydrated.  He reports the numbness has completely resolved there is no spasticity.  He notes fatigue and sleepiness some days but most days this is mild.   Cognition is doing well.   No mood disturbance.  He gets 4 to 5 hours of sleep straight and then may wake up once or twice after that.  He has a newborn baby boy and wakes up some at night.     Update 11/07/2017:   He has been on Tysabri since about June 2018. He tolerates it well and has not had any exacerbations. His JCV antibody was negative last tested 02/21/2018.  The major symptoms from his multiple sclerosis. Specifically he does not note any problems with his gait. There is no numbness or weakness. He works out periodically. At presentation he had right facial numbness and  slurred speech but those resolved.   There is no difficulty with vision.   Bladder function is fine. . There is no depression or cognitive difficulties.   He notes fatigue but works out regularly and plays basketball.  He is expecting his first child over the summer.   Update 05/26/2017:   After his last visit 3 months ago, he was started on natalizumab. He is JCV antibody negative and he tolerates it well.    He has not had further exacerbations since starting.  He denies any gait issues, numbness and weakness and is back working out in the gym.  The right facial numbness, slurred speech and clumsiness have completely resolved.  He denies fatigue.   His sleep is unchanged with mild insomnia some nights.  He denies any difficulty with mood or cognition.  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 He also noted some mild numbness and clumsiness in the right hand. He did not note any symptoms in the leg. The left side was fine. He had no trouble with his walking. When he returned to [facility] and his symptoms persisted, he presented to the emergency room. An MRI of  the brain was performed showing multiple T2/flair hyperintense lesions consistent with MS. Two foci, one larger one in the left centrum semiovale ovale and a smaller one on the right side of the midbrain enhanced consistent with more acute lesions. While in the hospital he also had an MRI of the cervical spine that showed one non-enhancing focus adjacent to C4. He received 5 days of IV steroids. By the time he got to [facility] on 02/15/2017, he was already better compared to couple days earlier and he improved further after the steroids. He currently notes just minimal symptoms in the right arm and improved problems  with numbness and in keeping the mouth tightly closed. In retrospect, he has not noted any other symptoms that could represent an earlier MS exacerbation. He is very active and exercises regularly.  Gait/strength/sensation: He denies any difficulty with his gait. He does not note any more weakness in the right hand. He still notes slight weakness in the right face. His voice is still a bit slurred. He notes some numbness in the right face and tongue.  Bladder function: He denies any urinary frequency, urgency or incontinence. There is no hesitancy. He does have nocturia once some night especially if he drinks more water.  Vision: He denies any difficulty with visual acuity. He does not note any diplopia and has not had this symptoms of diplopia in the past. At times, in the past he would note some eyestrain.  Fatigue/sleep:   He has not noted any major problems with fatigue. He is sleeping well at night.        Mood/cognition: He denies any difficulties with depression or anxiety. He has not noted any difficulties with cognition.  Other: He was found to have vitamin D deficiency with a level of 17.8 while in the hospital.    I personally reviewed the MRI of the brain and cervical spine from 02/15/2017 and 02/17/2017. The MRI of the brain shows a large enhancing lesion in the centrum semiovale ovale on the left and a small right midbrain lesion that were enhancing consistent with acute MS plaque. There were several other periventricular foci consistent with MS. The MRI of the spinal cord showed a plaque anteriorly at the C4 level.  There is no family history of seizures. Mr. Kuhner had a single seizure when he was talking.  REVIEW OF SYSTEMS: Constitutional: No fevers, chills, sweats, or change in appetite Eyes: No visual changes, double vision, eye pain Ear, nose and throat: No hearing loss, ear pain, nasal congestion, sore throat Cardiovascular: No chest pain, palpitations Respiratory: No  shortness of breath at rest or with exertion.   No wheezes GastrointestinaI: No nausea, vomiting, diarrhea, abdominal pain, fecal incontinence Genitourinary: No dysuria, urinary retention or frequency.  No nocturia. Musculoskeletal: No neck pain, back pain Integumentary: No rash, pruritus, skin lesions Neurological: as above Psychiatric: No depression at this time.  No anxiety Endocrine: No palpitations, diaphoresis, change in appetite, change in weigh or increased thirst Hematologic/Lymphatic: No anemia, purpura, petechiae. Allergic/Immunologic: No itchy/runny eyes, nasal congestion, recent allergic reactions, rashes  ALLERGIES: Allergies  Allergen Reactions  . Nickel   . Other     Fragrances    HOME MEDICATIONS:  Current Outpatient Medications:  .  clobetasol (OLUX) 0.05 % topical foam, Apply topically 2 (two) times daily., Disp: 150 g, Rfl: 1 .  natalizumab (TYSABRI) 300 MG/15ML injection, Inject 300 mg into the vein every 30 (thirty) days., Disp: , Rfl:  .  Vitamin D, Ergocalciferol, (  DRISDOL) 50000 units CAPS capsule, Take 1 capsule (50,000 Units total) by mouth every 7 (seven) days., Disp: 12 capsule, Rfl: 0  PAST MEDICAL HISTORY: Past Medical History:  Diagnosis Date  . Multiple sclerosis (HCC)   . Seizures (HCC)     PAST SURGICAL HISTORY: Past Surgical History:  Procedure Laterality Date  . FRACTURE SURGERY     R elbow, 2nd grade    FAMILY HISTORY: Family History  Problem Relation Age of Onset  . Asthma Mother   . Allergies Mother   . Urticaria Mother   . Healthy Father   . Asthma Brother   . Allergies Brother   . Diabetes Maternal Grandmother   . Diabetes Paternal Grandmother   . Allergic rhinitis Neg Hx   . Angioedema Neg Hx   . Atopy Neg Hx   . Eczema Neg Hx   . Immunodeficiency Neg Hx     SOCIAL HISTORY:  Social History   Socioeconomic History  . Marital status: Married    Spouse name: Not on file  . Number of children: Not on file  .  Years of education: Not on file  . Highest education level: Not on file  Occupational History  . Not on file  Social Needs  . Financial resource strain: Not on file  . Food insecurity:    Worry: Not on file    Inability: Not on file  . Transportation needs:    Medical: Not on file    Non-medical: Not on file  Tobacco Use  . Smoking status: Former Games developer  . Smokeless tobacco: Never Used  Substance and Sexual Activity  . Alcohol use: No  . Drug use: No  . Sexual activity: Yes    Birth control/protection: Pill    Comment: 1 partner last year  Lifestyle  . Physical activity:    Days per week: Not on file    Minutes per session: Not on file  . Stress: Not on file  Relationships  . Social connections:    Talks on phone: Not on file    Gets together: Not on file    Attends religious service: Not on file    Active member of club or organization: Not on file    Attends meetings of clubs or organizations: Not on file    Relationship status: Not on file  . Intimate partner violence:    Fear of current or ex partner: Not on file    Emotionally abused: Not on file    Physically abused: Not on file    Forced sexual activity: Not on file  Other Topics Concern  . Not on file  Social History Narrative  . Not on file     PHYSICAL EXAM  There were no vitals filed for this visit.  There is no height or weight on file to calculate BMI.   General: The patient is well-developed and well-nourished and in no acute distress  Neurologic Exam  Mental status: The patient is alert and oriented x 3 at the time of the examination. The patient has apparent normal recent and remote memory, with an apparently normal attention span and concentration ability.   Speech is normal in content.  Cranial nerves: Extraocular movements are full. Pupils are reactive and equal. Facial strength and sensation is normal. Trapezius strength is normal. There is no slurred speech..  The tongue is midline, and  the patient has symmetric elevation of the soft palate. No obvious hearing deficits are noted.  Motor:  Muscle bulk is normal.   Tone is normal. Strength is  5 / 5 in all 4 extremities.   Sensory: he has intact touch, temperature and vibration sensation in the arms and legs.remities.  Coordination: Cerebellar testing reveals good finger-nose-finger and heel-to-shin bilaterally.  Gait and station: Station is normal.   The gait is normal. He has a normal tandem gait. The Romberg is negative..   Reflexes: Deep tendon reflexes are symmetric and normal bilaterally in the arms. Knee reflexes are mildly increased. There is no ankle clonus or spread at knees.    DIAGNOSTIC DATA (LABS, IMAGING, TESTING) - I reviewed patient records, labs, notes, testing and imaging myself where available.  Lab Results  Component Value Date   WBC 17.3 (H) 02/19/2017   HGB 12.6 (L) 02/19/2017   HCT 39.9 02/19/2017   MCV 75.7 (L) 02/19/2017   PLT 167 02/19/2017      Component Value Date/Time   NA 136 02/19/2017 0335   K 3.7 02/19/2017 0335   CL 100 (L) 02/19/2017 0335   CO2 29 02/19/2017 0335   GLUCOSE 143 (H) 02/19/2017 0335   BUN 17 02/19/2017 0335   CREATININE 1.10 02/19/2017 0335   CALCIUM 8.8 (L) 02/19/2017 0335   PROT 8.2 (H) 02/15/2017 1449   ALBUMIN 4.7 02/15/2017 1449   AST 27 02/15/2017 1449   ALT 49 02/15/2017 1449   ALKPHOS 48 02/15/2017 1449   BILITOT 0.8 02/15/2017 1449   GFRNONAA >60 02/19/2017 0335   GFRAA >60 02/19/2017 0335   Lab Results  Component Value Date   CHOL 90 02/16/2017   HDL 22 (L) 02/16/2017   LDLCALC 39 02/16/2017   TRIG 146 02/16/2017   CHOLHDL 4.1 02/16/2017   Lab Results  Component Value Date   HGBA1C 5.0 02/16/2017   No results found for: VITAMINB12 Lab Results  Component Value Date   TSH 1.517 02/16/2017       ASSESSMENT AND PLAN  Multiple sclerosis (HCC) - Plan: Stratify JCV Antibody Test ([facility]), CBC with Differential/Platelet  High risk  medication use  Numbness  Vitamin D deficiency   1.   Continue Tysabri.   E will check a JCV antibody again. I discussed with him that if he converted from negative to middle or high positive then I would want him to consider switching to a different medication.   We will check an MRI of the brain to determine if he is having any subclinical progression. If present, consider a different disease modifying therapy. 2.   Continue Vit D 5000 units daily. 3.   He should continue to be active and exercises as tolerated. Return in 6 months or sooner if there are new or worsening neurologic symptoms.  Meyli Boice A. Epimenio Foot, MD, [facility] 05/10/2018, 4:46 PM Certified in Neurology, Clinical Neurophysiology, Sleep Medicine, Pain Medicine and Neuroimaging  [facility] [address], [address] [place], [place] [address] [phone]

## 2018-05-11 LAB — CBC WITH DIFFERENTIAL/PLATELET
BASOS ABS: 0 10*3/uL (ref 0.0–0.2)
Basos: 0 %
EOS (ABSOLUTE): 0.2 10*3/uL (ref 0.0–0.4)
EOS: 3 %
HEMATOCRIT: 41 % (ref 37.5–51.0)
HEMOGLOBIN: 12.5 g/dL — AB (ref 13.0–17.7)
IMMATURE GRANS (ABS): 0 10*3/uL (ref 0.0–0.1)
Immature Granulocytes: 0 %
LYMPHS: 65 %
Lymphocytes Absolute: 4.4 10*3/uL — ABNORMAL HIGH (ref 0.7–3.1)
MCH: 23.9 pg — ABNORMAL LOW (ref 26.6–33.0)
MCHC: 30.5 g/dL — ABNORMAL LOW (ref 31.5–35.7)
MCV: 78 fL — ABNORMAL LOW (ref 79–97)
MONOCYTES: 6 %
Monocytes Absolute: 0.4 10*3/uL (ref 0.1–0.9)
Neutrophils Absolute: 1.7 10*3/uL (ref 1.4–7.0)
Neutrophils: 26 %
Platelets: 175 10*3/uL (ref 150–450)
RBC: 5.23 x10E6/uL (ref 4.14–5.80)
RDW: 14.8 % (ref 12.3–15.4)
WBC: 6.8 10*3/uL (ref 3.4–10.8)

## 2018-05-16 ENCOUNTER — Encounter: Payer: Self-pay | Admitting: *Deleted

## 2018-09-03 ENCOUNTER — Encounter: Payer: Self-pay | Admitting: *Deleted

## 2018-09-03 NOTE — Progress Notes (Signed)
[facility] Prescribing Program auth valid from 09/03/18-04/07/19. Pt. enrollment# [id number]. Site auth# [id number] ([facility])/fim

## 2018-11-08 ENCOUNTER — Telehealth: Payer: Self-pay | Admitting: *Deleted

## 2018-11-08 ENCOUNTER — Ambulatory Visit: Payer: BLUE CROSS/BLUE SHIELD | Admitting: Neurology

## 2018-11-08 NOTE — Telephone Encounter (Signed)
Called pt and r/s appt from today to 11/20/18 at 830am with Dr. Epimenio Foot since we cx d/t computers being down.

## 2018-11-19 ENCOUNTER — Telehealth: Payer: Self-pay | Admitting: *Deleted

## 2018-11-19 NOTE — Telephone Encounter (Signed)
LVM informing the patient that Due to current COVID 19 pandemic, our office is severely reducing in office visits for at least the next 2 weeks, in order to minimize the risk to our patients and healthcare providers. I advised him Dr Epimenio Foot will call him for a virtual visit on the phone tomorrow at his appointment time of 8:30 am. I requested he call back to confirm he got this message; he may let phone staff know. Phone staff will inform me.

## 2018-11-20 ENCOUNTER — Other Ambulatory Visit: Payer: Self-pay

## 2018-11-20 ENCOUNTER — Telehealth (INDEPENDENT_AMBULATORY_CARE_PROVIDER_SITE_OTHER): Payer: BLUE CROSS/BLUE SHIELD | Admitting: Neurology

## 2018-11-20 DIAGNOSIS — Z79899 Other long term (current) drug therapy: Secondary | ICD-10-CM | POA: Diagnosis not present

## 2018-11-20 DIAGNOSIS — G35 Multiple sclerosis: Secondary | ICD-10-CM | POA: Diagnosis not present

## 2018-11-20 DIAGNOSIS — R5383 Other fatigue: Secondary | ICD-10-CM

## 2018-11-20 NOTE — Progress Notes (Signed)
Virtual Visit via Telephone Note  I connected with Dylan Mccoy on 11/20/18 at  8:30 AM EDT by telephone and verified that I am speaking with the correct person using two identifiers.   I discussed the limitations, risks, security and privacy concerns of performing an evaluation and management service by telephone and the availability of in person appointments. I also discussed with the patient that there may be a patient responsible charge related to this service. The patient expressed understanding and agreed to proceed.   History of Present Illness: He is a 38 year old man with multiple sclerosis on Tysabri therapy.  He has tolerated it well.  There have been no complications.  His last JCV antibody was negative (0.3; negative on the inhibition assay).  The last MRI of the brain was performed March 2019 and showed no new findings.  Furthermore, some of the older lesions have a better appearance.  He feels he is doing very well.  He has no difficulty with gait.  He plays basketball and stays active.  No problems with strength or sensation or coordination.  Bladder function is fine.  He has only mild fatigue.  He is sleeping better now that his 35-month-old baby is sleeping through the night.  He does not note any cognitive issues or mood disturbance.     From 05/10/2018:     He has been on Tysabri for over a year.  He has not had any exacerbations.  He tolerates it well.  The JCV antibody titer was 0.3 (negative on inhibition assay).  MRI of the brain March 2019 showed no acute findings.  Several of the foci that enhanced on the previous brain MRI were smaller.  There were no enhancing lesions.  He is doing very well with gait and can climb stairs and ladders.   He plays basketball and does well but notes if he overheats, he feels weaker and off balanced some.     He stays well hydrated.  He reports the numbness has completely resolved there is no spasticity.  He notes fatigue and  sleepiness some days but most days this is mild.   Cognition is doing well.   No mood disturbance.  He gets 4 to 5 hours of sleep straight and then may wake up once or twice after that.  He has a newborn baby boy and wakes up some at night.       Observations/Objective: Telephone call he had voice and speech.  There did not appear to be any difficulty with focus, attention or memory.  Assessment and Plan: Multiple sclerosis (HCC)  High risk medication use  Other fatigue   1.   He will continue Tysabri.  His next infusion is later this week and we will check a JCV antibody at that time. 2.   He will return to see me in 6 months or sooner if there are new or worsening neurologic symptoms.  Follow Up Instructions:   I discussed the assessment and treatment plan with the patient. The patient was provided an opportunity to ask questions and all were answered. The patient agreed with the plan and demonstrated an understanding of the instructions.   The patient was advised to call back or seek an in-person evaluation if the symptoms worsen or if the condition fails to improve as anticipated.  I provided 22 minutes of non-face-to-face time during this encounter.  Cinde Ebert A. Epimenio Foot, MD, PhD, FAAN Certified in Neurology, Clinical Neurophysiology, Sleep Medicine, Pain Medicine and  Neuroimaging Director, [facility] at [facility]  [facility] [address] [place], [place] [address] [phone]

## 2019-02-25 ENCOUNTER — Telehealth: Payer: Self-pay | Admitting: *Deleted

## 2019-02-25 NOTE — Telephone Encounter (Signed)
Called, LVM for pt to come by office to get repeat JCV lab completed this week. He is past due and needs prior to next infusion beginning of July. Advised him to avoid coming between 12-1pm d/t lab tech taking lunch at this time.

## 2019-02-28 NOTE — Telephone Encounter (Signed)
Called, LVM for pt to call office again.  I called wife, Kristine Garbe (on [place]). She confirmed he received my message. She wanted to schedule appt tomorrow but I advised office closed on Friday's d/t covid-19. She will have him come on Monday (he has work off that day). Advised they can come between 8-4pm and to avoid 12-1pm since lab tech takes lunch during that time.

## 2019-03-04 ENCOUNTER — Telehealth: Payer: Self-pay | Admitting: *Deleted

## 2019-03-04 NOTE — Telephone Encounter (Signed)
Faxed completed/signed Tysabri pt status report and reauth questionnaire to [facility] at [phone]. Received confirmation.  

## 2019-03-04 NOTE — Telephone Encounter (Signed)
Received fax notification from touch prescribing program that pt re-auth for Tysabri from 03/04/19-10/07/19. Pt enrollment number: [id number]. Account: [facility]. Site auth number: [id number].

## 2019-03-06 ENCOUNTER — Other Ambulatory Visit: Payer: Self-pay

## 2019-03-06 ENCOUNTER — Telehealth: Payer: Self-pay | Admitting: *Deleted

## 2019-03-06 ENCOUNTER — Other Ambulatory Visit (INDEPENDENT_AMBULATORY_CARE_PROVIDER_SITE_OTHER): Payer: Self-pay

## 2019-03-06 DIAGNOSIS — G35 Multiple sclerosis: Secondary | ICD-10-CM

## 2019-03-06 DIAGNOSIS — Z0289 Encounter for other administrative examinations: Secondary | ICD-10-CM

## 2019-03-06 NOTE — Telephone Encounter (Signed)
Placed JCV lab in [facility] lock box for routine lab pick up. Results pending. 

## 2019-03-07 LAB — CBC WITH DIFFERENTIAL/PLATELET
Basophils Absolute: 0.1 10*3/uL (ref 0.0–0.2)
Basos: 1 %
EOS (ABSOLUTE): 0.3 10*3/uL (ref 0.0–0.4)
Eos: 3 %
Hematocrit: 41.8 % (ref 37.5–51.0)
Hemoglobin: 13.2 g/dL (ref 13.0–17.7)
Immature Grans (Abs): 0 10*3/uL (ref 0.0–0.1)
Immature Granulocytes: 0 %
Lymphocytes Absolute: 4.7 10*3/uL — ABNORMAL HIGH (ref 0.7–3.1)
Lymphs: 62 %
MCH: 23.6 pg — ABNORMAL LOW (ref 26.6–33.0)
MCHC: 31.6 g/dL (ref 31.5–35.7)
MCV: 75 fL — ABNORMAL LOW (ref 79–97)
Monocytes Absolute: 0.7 10*3/uL (ref 0.1–0.9)
Monocytes: 8 %
NRBC: 1 % — ABNORMAL HIGH (ref 0–0)
Neutrophils Absolute: 2 10*3/uL (ref 1.4–7.0)
Neutrophils: 26 %
Platelets: 181 10*3/uL (ref 150–450)
RBC: 5.59 x10E6/uL (ref 4.14–5.80)
RDW: 13.9 % (ref 11.6–15.4)
WBC: 7.7 10*3/uL (ref 3.4–10.8)

## 2019-03-18 NOTE — Telephone Encounter (Signed)
JCV drawn on 03/06/19 indeterminate, index: 0.21. Inhibition assay negative.

## 2019-05-23 ENCOUNTER — Encounter: Payer: Self-pay | Admitting: Neurology

## 2019-05-23 ENCOUNTER — Ambulatory Visit: Payer: BC Managed Care – PPO | Admitting: Neurology

## 2019-05-23 ENCOUNTER — Other Ambulatory Visit: Payer: Self-pay

## 2019-05-23 VITALS — BP 105/60 | HR 60 | Temp 97.1°F | Ht 74.0 in | Wt 241.0 lb

## 2019-05-23 DIAGNOSIS — G35 Multiple sclerosis: Secondary | ICD-10-CM

## 2019-05-23 DIAGNOSIS — E559 Vitamin D deficiency, unspecified: Secondary | ICD-10-CM

## 2019-05-23 DIAGNOSIS — Z79899 Other long term (current) drug therapy: Secondary | ICD-10-CM

## 2019-05-23 NOTE — Progress Notes (Signed)
[facility]  PATIENT: Dylan Mccoy DOB: 1980/09/19  REFERRING DOCTOR OR PCP:  Referred by Dr. Amada Jupiter     SOURCE: Patient, notes from his recent hospital admission, imaging lab reports, MRI images on PACS.  _________________________________   HISTORICAL  CHIEF COMPLAINT:  Chief Complaint  Patient presents with  . Follow-up    RM 12, alone. Last seen 11/20/2018. Denies new sx.   . Multiple Sclerosis    On tysbari. Last JCV drawn 03/06/19 indeterminate, 0.21, inhibition assay: negative. Last infusion: 05/08/19 and next infusion 06/05/19    HISTORY OF PRESENT ILLNESS:  Dylan Mccoy is a 38 y.o. man with relapsing remitting multiple sclerosis diagnosed in June 2018  Update 05/23/2019: He has been on Tysabri since 2018 an the tolerates it well.  He has no exacerbations  Last JCV Ab was negative 03/06/2019.    His last MRI 10/2017 showed no new lesions and the lesions that enhanced in 2018 looked smaller.    He has one focus in the right cerebral peduncle and one in his cervical spine.   He also had a left hemispheric tumefactive lesion.   He is noting no issues with his gait or with strength and sensation.   He is working as a Pensions consultant with some physical component.  He has no difficulty climbing stairs or ladders.    Vision is normal.   Bladder function is good.  He denies fatigue.  He is sleeping well most nights.   Mood and cognition are doing well.     Update 05/10/2018:     He has been on Tysabri for over a year.  He has not had any exacerbations.  He tolerates it well.  The JCV antibody titer was 0.3 (negative on inhibition assay).  MRI of the brain March 2019 showed no acute findings.  Several of the foci that enhanced on the previous brain MRI were smaller.  There were no enhancing lesions.  He is doing very well with gait and can climb stairs and ladders.   He plays basketball and does well but notes if he overheats, he feels weaker and off balanced some.      He stays well hydrated.  He reports the numbness has completely resolved there is no spasticity.  He notes fatigue and sleepiness some days but most days this is mild.   Cognition is doing well.   No mood disturbance.  He gets 4 to 5 hours of sleep straight and then may wake up once or twice after that.  He has a newborn baby boy and wakes up some at night.     Update 11/07/2017:   He has been on Tysabri since about June 2018. He tolerates it well and has not had any exacerbations. His JCV antibody was negative last tested 02/21/2018.  The major symptoms from his multiple sclerosis. Specifically he does not note any problems with his gait. There is no numbness or weakness. He works out periodically. At presentation he had right facial numbness and slurred speech but those resolved.   There is no difficulty with vision.   Bladder function is fine. . There is no depression or cognitive difficulties.   He notes fatigue but works out regularly and plays basketball.  He is expecting his first child over the summer.   Update 05/26/2017:   After his last visit 3 months ago, he was started on natalizumab. He is JCV antibody negative and he tolerates it well.    He  has not had further exacerbations since starting.  He denies any gait issues, numbness and weakness and is back working out in the gym.  The right facial numbness, slurred speech and clumsiness have completely resolved.  He denies fatigue.   His sleep is unchanged with mild insomnia some nights.  He denies any difficulty with mood or cognition.  He works as a Pensions consultanttechnician at a [employer] and has no difficulty   _________________________________ From 02/21/2017 On 02/10/2017, [place], he woke up with numbness on the right side of his face, his tongue feeling thick and having slurred speech. He also noted some mild numbness and clumsiness in the right hand. He did not note any symptoms in the leg. The left side was fine.  He had no trouble with his walking. [place] [facility] [place] [facility] and his symptoms persisted, he presented to the emergency room. An MRI of the brain was performed showing multiple T2/flair hyperintense lesions consistent with MS. Two foci, one larger one in the left centrum semiovale ovale and a smaller one on the right side of the midbrain enhanced consistent with more acute lesions. While in the hospital he also had an MRI of the cervical spine that showed one non-enhancing focus adjacent to C4. He received 5 days of IV steroids. By the [place] [facility] got [place] [facility], he was already better compared to couple days earlier and he improved further after the steroids. He currently notes just minimal symptoms in the right arm and improved problems with numbness and in keeping the mouth tightly closed. In retrospect, he has not noted any other symptoms that could represent an earlier MS exacerbation. He is very active and exercises regularly.  Gait/strength/sensation: He denies any difficulty with his gait. He does not note any more weakness in the right hand. He still notes slight weakness in the right face. His voice is still a bit slurred. He notes some numbness in the right face and tongue.  Bladder function: He denies any urinary frequency, urgency or incontinence. There is no hesitancy. He does have nocturia once some night especially if he drinks more water.  Vision: He denies any difficulty with visual acuity. He does not note any diplopia and has not had this symptoms of diplopia in the past. At times, in the past he would note some eyestrain.  Fatigue/sleep:   He has not noted any major problems with fatigue. He is sleeping well at night.        Mood/cognition: He denies any difficulties with depression or anxiety. He has not noted any difficulties with cognition.  Other: He was found to have vitamin D deficiency with a level of 17.8 while in the hospital.    I personally reviewed the  MRI of the brain and cervical spine from 02/15/2017 and 02/17/2017. The MRI of the brain shows a large enhancing lesion in the centrum semiovale ovale on the left and a small right midbrain lesion that were enhancing consistent with acute MS plaque. There were several other periventricular foci consistent with MS. The MRI of the spinal cord showed a plaque anteriorly at the C4 level.  There is no family history of seizures. Mr. Laural BenesJohnson had a single seizure when he was talking.  REVIEW OF SYSTEMS: Constitutional: No fevers, chills, sweats, or change in appetite Eyes: No visual changes, double vision, eye pain Ear, nose and throat: No hearing loss, ear pain, nasal congestion, sore throat Cardiovascular: No chest pain, palpitations Respiratory:  No shortness of breath at rest or with exertion.   No wheezes GastrointestinaI: No nausea, vomiting, diarrhea, abdominal pain, fecal incontinence Genitourinary: No dysuria, urinary retention or frequency.  No nocturia. Musculoskeletal: No neck pain, back pain Integumentary: No rash, pruritus, skin lesions Neurological: as above Psychiatric: No depression at this time.  No anxiety Endocrine: No palpitations, diaphoresis, change in appetite, change in weigh or increased thirst Hematologic/Lymphatic: No anemia, purpura, petechiae. Allergic/Immunologic: No itchy/runny eyes, nasal congestion, recent allergic reactions, rashes  ALLERGIES: Allergies  Allergen Reactions  . Nickel   . Other     Fragrances    HOME MEDICATIONS:  Current Outpatient Medications:  .  clobetasol (OLUX) 0.05 % topical foam, Apply topically 2 (two) times daily., Disp: 150 g, Rfl: 1 .  natalizumab (TYSABRI) 300 MG/15ML injection, Inject 300 mg into the vein every 30 (thirty) days., Disp: , Rfl:  .  VITAMIN D PO, Take 4,000 Units by mouth daily., Disp: , Rfl:   PAST MEDICAL HISTORY: Past Medical History:  Diagnosis Date  . Multiple sclerosis ([place])   . Seizures ([place])      PAST SURGICAL HISTORY: Past Surgical History:  Procedure Laterality Date  . FRACTURE SURGERY     R elbow, 2nd grade    FAMILY HISTORY: Family History  Problem Relation Age of Onset  . Asthma Mother   . Allergies Mother   . Urticaria Mother   . Healthy Father   . Asthma Brother   . Allergies Brother   . Diabetes Maternal Grandmother   . Diabetes Paternal Grandmother   . Allergic rhinitis Neg Hx   . Angioedema Neg Hx   . Atopy Neg Hx   . Eczema Neg Hx   . Immunodeficiency Neg Hx     SOCIAL HISTORY:  Social History   Socioeconomic History  . Marital status: Married    Spouse name: Not on file  . Number of children: Not on file  . Years of education: Not on file  . Highest education level: Not on file  Occupational History  . Not on file  Social Needs  . Financial resource strain: Not on file  . Food insecurity    Worry: Not on file    Inability: Not on file  . Transportation needs    Medical: Not on file    Non-medical: Not on file  Tobacco Use  . Smoking status: Former Research scientist (life sciences)  . Smokeless tobacco: Never Used  Substance and Sexual Activity  . Alcohol use: No  . Drug use: No  . Sexual activity: Yes    Birth control/protection: Pill    Comment: 1 partner last year  Lifestyle  . Physical activity    Days per week: Not on file    Minutes per session: Not on file  . Stress: Not on file  Relationships  . Social Herbalist on phone: Not on file    Gets together: Not on file    Attends religious service: Not on file    Active member of club or organization: Not on file    Attends meetings of clubs or organizations: Not on file    Relationship status: Not on file  . Intimate partner violence    Fear of current or ex partner: Not on file    Emotionally abused: Not on file    Physically abused: Not on file    Forced sexual activity: Not on file  Other Topics Concern  . Not on file  Social  History Narrative  . Not on file     PHYSICAL EXAM   Vitals:   05/23/19 1059  BP: 105/60  Pulse: 60  Temp: (!) 97.1 F (36.2 C)  SpO2: 98%  Weight: 241 lb (109.3 kg)  Height: 6\' 2"  (1.88 m)    Body mass index is 30.94 kg/m.   General: The patient is well-developed and well-nourished and in no acute distress  Neurologic Exam  Mental status: The patient is alert and oriented x 3 at the time of the examination. The patient has apparent normal recent and remote memory, with an apparently normal attention span and concentration ability.   Speech is normal in content.  Cranial nerves: Extraocular movements are full.  Facial strength and sensation is normal. Trapezius strength is normal. There is no slurred speech..   No obvious hearing deficits are noted.  Motor:  Muscle bulk is normal.   Tone is normal. Strength is  5 / 5 in all 4 extremities.   Sensory: he has intact touch, temperature and vibration sensation in the arms and legs.remities.  Coordination: Cerebellar testing reveals good finger-nose-finger and heel-to-shin bilaterally.  Gait and station: Station is normal.  Gait and tandem gait are normal.  The Romberg is negative..   Reflexes: Deep tendon reflexes are symmetric and normal bilaterally in the arms. Knee reflexes are mildly increased. There is no ankle clonus or spread at knees.    DIAGNOSTIC DATA (LABS, IMAGING, TESTING) - I reviewed patient records, labs, notes, testing and imaging myself where available.  Lab Results  Component Value Date   WBC 7.7 03/06/2019   HGB 13.2 03/06/2019   HCT 41.8 03/06/2019   MCV 75 (L) 03/06/2019   PLT 181 03/06/2019      Component Value Date/Time   NA 136 02/19/2017 0335   K 3.7 02/19/2017 0335   CL 100 (L) 02/19/2017 0335   CO2 29 02/19/2017 0335   GLUCOSE 143 (H) 02/19/2017 0335   BUN 17 02/19/2017 0335   CREATININE 1.10 02/19/2017 0335   CALCIUM 8.8 (L) 02/19/2017 0335   PROT 8.2 (H) 02/15/2017 1449   ALBUMIN 4.7 02/15/2017 1449   AST 27 02/15/2017 1449   ALT 49  02/15/2017 1449   ALKPHOS 48 02/15/2017 1449   BILITOT 0.8 02/15/2017 1449   GFRNONAA >60 02/19/2017 0335   GFRAA >60 02/19/2017 0335   Lab Results  Component Value Date   CHOL 90 02/16/2017   HDL 22 (L) 02/16/2017   LDLCALC 39 02/16/2017   TRIG 146 02/16/2017   CHOLHDL 4.1 02/16/2017   Lab Results  Component Value Date   HGBA1C 5.0 02/16/2017   No results found for: [id number] Lab Results  Component Value Date   TSH 1.517 02/16/2017       ASSESSMENT AND PLAN  Multiple sclerosis (HCC)  High risk medication use  Vitamin D deficiency   1.   Continue Tysabri.   His recent JCV Ab test is negative.   We will check an MRI of the brain to determine if he is having any subclinical progression. If present, consider a different disease modifying therapy. 2.   Continue Vit D 12-5998 units daily. 3.   He should continue to be active and exercises as tolerated. Return in 6 months or sooner if there are new or worsening neurologic symptoms.  Saket Hellstrom A. Epimenio Foot, MD, [facility] 05/23/2019, 11:39 AM Certified in Neurology, Clinical Neurophysiology, Sleep Medicine, Pain Medicine and Neuroimaging  [facility] [address]  [place], [place] [address] [phone]

## 2019-05-29 ENCOUNTER — Telehealth: Payer: Self-pay | Admitting: Neurology

## 2019-05-29 NOTE — Telephone Encounter (Signed)
no to the covid questions MR Brain w/wo contrast Dr. Cheree Ditto Auth: [facility] Ref # [id number]. Patient is scheduled at [facility] for 06/05/19.

## 2019-06-05 ENCOUNTER — Other Ambulatory Visit: Payer: Self-pay

## 2019-06-05 ENCOUNTER — Ambulatory Visit: Payer: BC Managed Care – PPO

## 2019-06-05 DIAGNOSIS — G35 Multiple sclerosis: Secondary | ICD-10-CM

## 2019-06-05 MED ORDER — GADOBENATE DIMEGLUMINE 529 MG/ML IV SOLN
20.0000 mL | Freq: Once | INTRAVENOUS | Status: AC | PRN
  Administered 2019-06-05: 08:00:00 20 mL via INTRAVENOUS

## 2019-06-06 ENCOUNTER — Telehealth: Payer: Self-pay | Admitting: *Deleted

## 2019-06-06 NOTE — Telephone Encounter (Signed)
Called, LVM for pt (ok per DPR) about MRI results. Gave [facility] phone number if he has further questions/concerns.

## 2019-06-06 NOTE — Telephone Encounter (Signed)
-----   Message from Britt Bottom, MD sent at 06/06/2019  8:30 AM EDT ----- Please let the patient know that the MRI looked good with no new lesions compared to last MRI

## 2019-09-03 ENCOUNTER — Telehealth: Payer: Self-pay | Admitting: *Deleted

## 2019-09-03 NOTE — Telephone Encounter (Signed)
PA done by [facility] for Tysabri denied. Dr. Epimenio Foot called [employer] [place] and did peer to peer. It was approved until 03/15/2020. [id number]. Gave to [facility] for their records.

## 2019-09-10 ENCOUNTER — Telehealth: Payer: Self-pay | Admitting: *Deleted

## 2019-09-10 ENCOUNTER — Other Ambulatory Visit: Payer: Self-pay | Admitting: *Deleted

## 2019-09-10 DIAGNOSIS — G35 Multiple sclerosis: Secondary | ICD-10-CM

## 2019-09-10 DIAGNOSIS — Z79899 Other long term (current) drug therapy: Secondary | ICD-10-CM

## 2019-09-10 NOTE — Telephone Encounter (Signed)
Faxed completed/signed Tysabri pt status report and reauth questionnaire to [facility] at [phone]. Received confirmation.   Spoke with Freddi Starr, RN in infusion suite. Pt coming 09/16/19 for next Tysabri infusion. She is aware pt will need labs that day when he comes for infusion. I placed future orders.

## 2019-09-11 NOTE — Telephone Encounter (Signed)
Received fax from [facility] prescribing program that pt re-authorized from 09/11/19-04/07/20. Pt enrollment number: [id number]. Account: [facility]. Site auth # [id number].

## 2019-11-21 ENCOUNTER — Other Ambulatory Visit: Payer: Self-pay

## 2019-11-21 ENCOUNTER — Ambulatory Visit (INDEPENDENT_AMBULATORY_CARE_PROVIDER_SITE_OTHER): Payer: BC Managed Care – PPO | Admitting: Neurology

## 2019-11-21 ENCOUNTER — Encounter: Payer: Self-pay | Admitting: Neurology

## 2019-11-21 ENCOUNTER — Telehealth: Payer: Self-pay | Admitting: *Deleted

## 2019-11-21 VITALS — BP 113/69 | HR 50 | Temp 96.9°F | Ht 74.0 in | Wt 240.5 lb

## 2019-11-21 DIAGNOSIS — E559 Vitamin D deficiency, unspecified: Secondary | ICD-10-CM

## 2019-11-21 DIAGNOSIS — R5383 Other fatigue: Secondary | ICD-10-CM | POA: Diagnosis not present

## 2019-11-21 DIAGNOSIS — Z79899 Other long term (current) drug therapy: Secondary | ICD-10-CM

## 2019-11-21 DIAGNOSIS — G35 Multiple sclerosis: Secondary | ICD-10-CM

## 2019-11-21 NOTE — Telephone Encounter (Signed)
Placed JCV lab in [facility] lock box for routine lab pick up. Results pending. 

## 2019-11-21 NOTE — Progress Notes (Signed)
[facility]  PATIENT: Dylan Mccoy DOB: 1980-09-07  REFERRING DOCTOR OR PCP:  Referred by Dr. Amada Jupiter     SOURCE: Patient, notes from his recent hospital admission, imaging lab reports, MRI images on PACS.  _________________________________   HISTORICAL  CHIEF COMPLAINT:  Chief Complaint  Patient presents with  . Follow-up    RM 12, alone. Last seen 05/23/2019.  . Multiple Sclerosis    On Tysabri. Lsat JCV 03/06/2019 indeterminate, index: 0.21. Inhibition assay: neg. NEEDS LABS. Last infusion:11/21/19 Next infusion: 12/09/2019. tolerating well, no issues.     HISTORY OF PRESENT ILLNESS:  Dylan Mccoy is a 39 y.o. man with relapsing remitting multiple sclerosis diagnosed in June 2018  Update 11/21/2019: He is doing well on Tysabri and denies any exacerbation or new neurologic symptoms.   Gait , strength and tone are fine.  He can go up and down stairs without holding the bannister.   He denies numbness or dysesthesias.   Bladder function is fine.   Vision is doing well.  He sleeps well most nights and denies much fatigue.     Mood and cognition are doing well.   He has a 45 year old son and is expecting a second child soon.  He has not yet been vaccinated and is not sure he wants to be.  We discussed pros and cons.      Update 05/23/2019: He has been on Tysabri since 2018 an the tolerates it well.  He has no exacerbations  Last JCV Ab was negative 03/06/2019.    His last MRI 10/2017 showed no new lesions and the lesions that enhanced in 2018 looked smaller.    He has one focus in the right cerebral peduncle and one in his cervical spine.   He also had a left hemispheric tumefactive lesion.   I reviewed his MRI 06/05/2019.   It showed multiple T2/flair hyperintense foci in the periventricular, juxtacortical and deep white matter in a pattern and configuration consistent with chronic demyelinating plaque associated with multiple sclerosis.  None of the foci  appears to be acute and they do not enhance.  They were all present on the MRI dated 11/21/2017.  LAst cervical spine MRi in 2018 showed one focus at C4.     He is noting no issues with his gait or with strength and sensation.   He is working as a Pensions consultant with some physical component.  He has no difficulty climbing stairs or ladders.    Vision is normal.   Bladder function is good.  He denies fatigue.  He is sleeping well most nights.   Mood and cognition are doing well.     Update 05/10/2018:     He has been on Tysabri for over a year.  He has not had any exacerbations.  He tolerates it well.  The JCV antibody titer was 0.3 (negative on inhibition assay).  MRI of the brain March 2019 showed no acute findings.  Several of the foci that enhanced on the previous brain MRI were smaller.  There were no enhancing lesions.  He is doing very well with gait and can climb stairs and ladders.   He plays basketball and does well but notes if he overheats, he feels weaker and off balanced some.     He stays well hydrated.  He reports the numbness has completely resolved there is no spasticity.  He notes fatigue and sleepiness some days but most days this is mild.  Cognition is doing well.   No mood disturbance.  He gets 4 to 5 hours of sleep straight and then may wake up once or twice after that.  He has a newborn baby boy and wakes up some at night.     Update 11/07/2017:   He has been on Tysabri since about June 2018. He tolerates it well and has not had any exacerbations. His JCV antibody was negative last tested 02/21/2018.  The major symptoms from his multiple sclerosis. Specifically he does not note any problems with his gait. There is no numbness or weakness. He works out periodically. At presentation he had right facial numbness and slurred speech but those resolved.   There is no difficulty with vision.   Bladder function is fine. . There is no depression or cognitive difficulties.   He notes fatigue  but works out regularly and plays basketball.  He is expecting his first child over the summer.   Update 05/26/2017:   After his last visit 3 months ago, he was started on natalizumab. He is JCV antibody negative and he tolerates it well.    He has not had further exacerbations since starting.  He denies any gait issues, numbness and weakness and is back working out in the gym.  The right facial numbness, slurred speech and clumsiness have completely resolved.  He denies fatigue.   His sleep is unchanged with mild insomnia some nights.  He denies any difficulty with mood or cognition.  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 He also noted some mild numbness and clumsiness in the right hand. He did not note any symptoms in the leg. The left side was fine. He had no trouble with his walking. When he returned to [facility] and his symptoms persisted, he presented to the emergency room. An MRI of the brain was performed showing multiple T2/flair hyperintense lesions consistent with MS. Two foci, one larger one in the left centrum semiovale ovale and a smaller one on the right side of the midbrain enhanced consistent with more acute lesions. While in the hospital he also had an MRI of the cervical spine that showed one non-enhancing focus adjacent to C4. He received 5 days of IV steroids. By the time he got to [facility] on 02/15/2017, he was already better compared to couple days earlier and he improved further after the steroids. He currently notes just minimal symptoms in the right arm and improved problems with numbness and in keeping the mouth tightly closed. In retrospect, he has not noted any other symptoms that could represent an earlier MS exacerbation. He is very active  and exercises regularly.  Gait/strength/sensation: He denies any difficulty with his gait. He does not note any more weakness in the right hand. He still notes slight weakness in the right face. His voice is still a bit slurred. He notes some numbness in the right face and tongue.  Bladder function: He denies any urinary frequency, urgency or incontinence. There is no hesitancy. He does have nocturia once some night especially if he drinks more water.  Vision: He denies any difficulty with visual acuity. He does not note any diplopia and has not had this symptoms of diplopia in the past. At times, in the past he would note some  eyestrain.  Fatigue/sleep:   He has not noted any major problems with fatigue. He is sleeping well at night.        Mood/cognition: He denies any difficulties with depression or anxiety. He has not noted any difficulties with cognition.  Other: He was found to have vitamin D deficiency with a level of 17.8 while in the hospital.    I personally reviewed the MRI of the brain and cervical spine from 02/15/2017 and 02/17/2017. The MRI of the brain shows a large enhancing lesion in the centrum semiovale ovale on the left and a small right midbrain lesion that were enhancing consistent with acute MS plaque. There were several other periventricular foci consistent with MS. The MRI of the spinal cord showed a plaque anteriorly at the C4 level.  There is no family history of seizures. Dylan Mccoy had a single seizure when he was talking.  REVIEW OF SYSTEMS: Constitutional: No fevers, chills, sweats, or change in appetite Eyes: No visual changes, double vision, eye pain Ear, nose and throat: No hearing loss, ear pain, nasal congestion, sore throat Cardiovascular: No chest pain, palpitations Respiratory: No shortness of breath at rest or with exertion.   No wheezes GastrointestinaI: No nausea, vomiting, diarrhea, abdominal pain, fecal incontinence Genitourinary: No dysuria,  urinary retention or frequency.  No nocturia. Musculoskeletal: No neck pain, back pain Integumentary: No rash, pruritus, skin lesions Neurological: as above Psychiatric: No depression at this time.  No anxiety Endocrine: No palpitations, diaphoresis, change in appetite, change in weigh or increased thirst Hematologic/Lymphatic: No anemia, purpura, petechiae. Allergic/Immunologic: No itchy/runny eyes, nasal congestion, recent allergic reactions, rashes  ALLERGIES: Allergies  Allergen Reactions  . Nickel   . Other     Fragrances    HOME MEDICATIONS:  Current Outpatient Medications:  .  clobetasol (OLUX) 0.05 % topical foam, Apply topically 2 (two) times daily., Disp: 150 g, Rfl: 1 .  natalizumab (TYSABRI) 300 MG/15ML injection, Inject 300 mg into the vein every 30 (thirty) days., Disp: , Rfl:  .  VITAMIN D PO, Take 4,000 Units by mouth daily., Disp: , Rfl:   PAST MEDICAL HISTORY: Past Medical History:  Diagnosis Date  . Multiple sclerosis (HCC)   . Seizures (HCC)     PAST SURGICAL HISTORY: Past Surgical History:  Procedure Laterality Date  . FRACTURE SURGERY     R elbow, 2nd grade    FAMILY HISTORY: Family History  Problem Relation Age of Onset  . Asthma Mother   . Allergies Mother   . Urticaria Mother   . Healthy Father   . Asthma Brother   . Allergies Brother   . Diabetes Maternal Grandmother   . Diabetes Paternal Grandmother   . Allergic rhinitis Neg Hx   . Angioedema Neg Hx   . Atopy Neg Hx   . Eczema Neg Hx   . Immunodeficiency Neg Hx     SOCIAL HISTORY:  Social History   Socioeconomic History  . Marital status: Married    Spouse name: Not on file  . Number of children: Not on file  . Years of education: Not on file  . Highest education level: Not on file  Occupational History  . Not on file  Tobacco Use  . Smoking status: Former Games developer  . Smokeless tobacco: Never Used  Substance and Sexual Activity  . Alcohol use: No  . Drug use: No  .  Sexual activity: Yes    Birth control/protection: Pill    Comment: 1 partner last  year  Other Topics Concern  . Not on file  Social History Narrative  . Not on file   Social Determinants of Health   Financial Resource Strain:   . Difficulty of Paying Living Expenses:   Food Insecurity:   . Worried About Charity fundraiser in the Last Year:   . Arboriculturist in the Last Year:   Transportation Needs:   . Film/video editor (Medical):   Marland Kitchen Lack of Transportation (Non-Medical):   Physical Activity:   . Days of Exercise per Week:   . Minutes of Exercise per Session:   Stress:   . Feeling of Stress :   Social Connections:   . Frequency of Communication with Friends and Family:   . Frequency of Social Gatherings with Friends and Family:   . Attends Religious Services:   . Active Member of Clubs or Organizations:   . Attends Archivist Meetings:   Marland Kitchen Marital Status:   Intimate Partner Violence:   . Fear of Current or Ex-Partner:   . Emotionally Abused:   Marland Kitchen Physically Abused:   . Sexually Abused:      PHYSICAL EXAM  Vitals:   11/21/19 0815  BP: 113/69  Pulse: (!) 50  Temp: (!) 96.9 F (36.1 C)  Weight: 240 lb 8 oz (109.1 kg)  Height: 6\' 2"  (1.88 m)    Body mass index is 30.88 kg/m.   General: The patient is well-developed and well-nourished and in no acute distress  Neurologic Exam  Mental status: The patient is alert and oriented x 3 at the time of the examination. The patient has apparent normal recent and remote memory, with an apparently normal attention span and concentration ability.   Speech is normal in content.  Cranial nerves: Extraocular movements are full.  Symmetric color vision.  Facial strength and sensation is normal. Trapezius strength is normal. Normal speech.   No obvious hearing deficits are noted.  Motor:  Muscle bulk is normal.   Tone is normal. Strength is  5 / 5 in all 4 extremities.   Sensory: he has intact touch,  temperature and vibration sensation in the arms and legs.remities.  Coordination: Cerebellar testing reveals good finger-nose-finger and heel-to-shin bilaterally.  Gait and station: Station is normal.  Gait and tandem gait are normal.  The Romberg is negative..   Reflexes: Deep tendon reflexes are symmetric and normal bilaterally in the arms. Knee reflexes are mildly increased but no ankle clonus or spread at knees.    DIAGNOSTIC DATA (LABS, IMAGING, TESTING) - I reviewed patient records, labs, notes, testing and imaging myself where available.  Lab Results  Component Value Date   WBC 7.7 03/06/2019   HGB 13.2 03/06/2019   HCT 41.8 03/06/2019   MCV 75 (L) 03/06/2019   PLT 181 03/06/2019      Component Value Date/Time   NA 136 02/19/2017 0335   K 3.7 02/19/2017 0335   CL 100 (L) 02/19/2017 0335   CO2 29 02/19/2017 0335   GLUCOSE 143 (H) 02/19/2017 0335   BUN 17 02/19/2017 0335   CREATININE 1.10 02/19/2017 0335   CALCIUM 8.8 (L) 02/19/2017 0335   PROT 8.2 (H) 02/15/2017 1449   ALBUMIN 4.7 02/15/2017 1449   AST 27 02/15/2017 1449   ALT 49 02/15/2017 1449   ALKPHOS 48 02/15/2017 1449   BILITOT 0.8 02/15/2017 1449   GFRNONAA >60 02/19/2017 0335   GFRAA >60 02/19/2017 0335   Lab Results  Component Value  Date   CHOL 90 02/16/2017   HDL 22 (L) 02/16/2017   LDLCALC 39 02/16/2017   TRIG 146 02/16/2017   CHOLHDL 4.1 02/16/2017   Lab Results  Component Value Date   HGBA1C 5.0 02/16/2017   No results found for: VITAMINB12 Lab Results  Component Value Date   TSH 1.517 02/16/2017       ASSESSMENT AND PLAN  Multiple sclerosis (HCC) - Plan: CBC with Differential/Platelet, Stratify JCV Antibody Test ([facility])  High risk medication use - Plan: CBC with Differential/Platelet, Stratify JCV Antibody Test ([facility]), VITAMIN D 25 Hydroxy (Vit-D Deficiency, Fractures)  Other fatigue  Vitamin D deficiency - Plan: VITAMIN D 25 Hydroxy (Vit-D Deficiency, Fractures)   1.    Continue Tysabri.   Check JCV Ab and CBC/D 2.   He has had vit D deficiency. Continue Vit D 12-5998 units daily.   Check level and supplement further if low 3.   Stay active and exercises as tolerated. Return in 6 months or sooner if there are new or worsening neurologic symptoms.  Marielouise Amey A. Epimenio Foot, MD, [facility] 11/21/2019, 8:48 AM Certified in Neurology, Clinical Neurophysiology, Sleep Medicine, Pain Medicine and Neuroimaging  [facility] [address] [place], [place] [address] [phone]

## 2019-11-22 LAB — CBC WITH DIFFERENTIAL/PLATELET
Basophils Absolute: 0.1 10*3/uL (ref 0.0–0.2)
Basos: 1 %
EOS (ABSOLUTE): 0.2 10*3/uL (ref 0.0–0.4)
Eos: 3 %
Hematocrit: 41.4 % (ref 37.5–51.0)
Hemoglobin: 12.7 g/dL — ABNORMAL LOW (ref 13.0–17.7)
Immature Grans (Abs): 0 10*3/uL (ref 0.0–0.1)
Immature Granulocytes: 0 %
Lymphocytes Absolute: 4 10*3/uL — ABNORMAL HIGH (ref 0.7–3.1)
Lymphs: 58 %
MCH: 23.6 pg — ABNORMAL LOW (ref 26.6–33.0)
MCHC: 30.7 g/dL — ABNORMAL LOW (ref 31.5–35.7)
MCV: 77 fL — ABNORMAL LOW (ref 79–97)
Monocytes Absolute: 0.7 10*3/uL (ref 0.1–0.9)
Monocytes: 10 %
NRBC: 2 % — ABNORMAL HIGH (ref 0–0)
Neutrophils Absolute: 2 10*3/uL (ref 1.4–7.0)
Neutrophils: 28 %
Platelets: 141 10*3/uL — ABNORMAL LOW (ref 150–450)
RBC: 5.38 x10E6/uL (ref 4.14–5.80)
RDW: 14.2 % (ref 11.6–15.4)
WBC: 7 10*3/uL (ref 3.4–10.8)

## 2019-11-22 LAB — VITAMIN D 25 HYDROXY (VIT D DEFICIENCY, FRACTURES): Vit D, 25-Hydroxy: 60 ng/mL (ref 30.0–100.0)

## 2019-11-25 ENCOUNTER — Telehealth: Payer: Self-pay | Admitting: *Deleted

## 2019-11-25 NOTE — Telephone Encounter (Signed)
Called and spoke with pt about results per Dr. Sater note. He verbalized understanding. 

## 2019-11-25 NOTE — Telephone Encounter (Signed)
-----   Message from Asa Lente, MD sent at 11/22/2019  6:00 PM EDT ----- The blood work shows a very mild anemia.  He should try to take an iron supplement that he can get over-the-counter.  Vitamin D was normal.  The JCV antibody is still pending and we will let him know if it has changed

## 2019-11-28 NOTE — Telephone Encounter (Signed)
JCV ab drawn on 11/21/19 positive, index: 0.41

## 2019-12-02 NOTE — Telephone Encounter (Signed)
Called pt. Relayed per Dr. Epimenio Foot that JCV came back borderline positive. Dr. Epimenio Foot would like to move him from every 4 weeks to every 6 weeks. He is scheduled with intrafusion currently on 12/09/2019. Asked that they call him to reschedule that to around 12/23/19. Pt aware they will be calling him to get this rescheduled.

## 2020-03-09 ENCOUNTER — Telehealth: Payer: Self-pay | Admitting: *Deleted

## 2020-03-09 NOTE — Telephone Encounter (Signed)
Faxed completed/signed Tysabri pt status report and reauth questionnaire to [facility] at [phone]. Received confirmation.   Received fax from touch prescribing program that pt re-auth for Tysabri from 03/09/20-10/07/20. Pt enrollment number: [id number]. Account: [facility]. Site auth number: [id number]

## 2020-04-29 ENCOUNTER — Telehealth: Payer: Self-pay | Admitting: *Deleted

## 2020-04-29 NOTE — Addendum Note (Signed)
Addended by: Tamera Stands D on: 04/29/2020 01:35 PM   Modules accepted: Orders

## 2020-04-29 NOTE — Telephone Encounter (Signed)
Placed JCV lab in [facility] lock box for routine lab pick up. Results pending. 

## 2020-04-30 LAB — CBC WITH DIFFERENTIAL/PLATELET
Basophils Absolute: 0.1 10*3/uL (ref 0.0–0.2)
Basos: 1 %
EOS (ABSOLUTE): 0.2 10*3/uL (ref 0.0–0.4)
Eos: 3 %
Hematocrit: 43.3 % (ref 37.5–51.0)
Hemoglobin: 13.1 g/dL (ref 13.0–17.7)
Immature Grans (Abs): 0 10*3/uL (ref 0.0–0.1)
Immature Granulocytes: 0 %
Lymphocytes Absolute: 4 10*3/uL — ABNORMAL HIGH (ref 0.7–3.1)
Lymphs: 55 %
MCH: 23.4 pg — ABNORMAL LOW (ref 26.6–33.0)
MCHC: 30.3 g/dL — ABNORMAL LOW (ref 31.5–35.7)
MCV: 77 fL — ABNORMAL LOW (ref 79–97)
Monocytes Absolute: 0.7 10*3/uL (ref 0.1–0.9)
Monocytes: 10 %
NRBC: 1 % — ABNORMAL HIGH (ref 0–0)
Neutrophils Absolute: 2.3 10*3/uL (ref 1.4–7.0)
Neutrophils: 31 %
Platelets: 170 10*3/uL (ref 150–450)
RBC: 5.6 x10E6/uL (ref 4.14–5.80)
RDW: 14.2 % (ref 11.6–15.4)
WBC: 7.4 10*3/uL (ref 3.4–10.8)

## 2020-05-02 LAB — STRATIFY JCV AB (W/ INDEX) W/ RFLX
Index Value: 0.82 — ABNORMAL HIGH
Stratify JCV (TM) Ab w/Reflex Inhibition: POSITIVE — AB

## 2020-05-21 NOTE — Progress Notes (Signed)
PATIENT: Dylan Mccoy DOB: February 03, 1981  REASON FOR VISIT: follow up HISTORY FROM: patient  Chief Complaint  Patient presents with  . Follow-up    rm 2-   . Multiple Sclerosis    pt said he is having no new sx,     HISTORY OF PRESENT ILLNESS: Today 05/25/20 Dylan Mccoy is a 39 y.o. male here today for follow up for RRMS on Tysabri.every 6 weeks due to positive JCV (0.82 on 9/1). Planning to recheck in 08/2020. May need to change DMT. MRI stable in 05/2019.   He is doing well, today. No new or exacerbating symptoms. Gait stable. No falls. NO changes in bowel or bladder habits. Mood is good. He admits that he is taking vitamin D daily. He is also taking iron supplements but admits that he often forgets to take it.   HISTORY: (copied from Dr Bonnita Hollow note on 11/21/2019)  Dylan Mccoy is a 39 y.o. man with relapsing remitting multiple sclerosis diagnosed in June 2018  Update 11/21/2019: He is doing well on Tysabri and denies any exacerbation or new neurologic symptoms.   Gait , strength and tone are fine.  He can go up and down stairs without holding the bannister.   He denies numbness or dysesthesias.   Bladder function is fine.   Vision is doing well.  He sleeps well most nights and denies much fatigue.     Mood and cognition are doing well.   He has a 18 year old son and is expecting a second child soon.  He has not yet been vaccinated and is not sure he wants to be.  We discussed pros and cons.      Update 05/23/2019: He has been on Tysabri since 2018 an the tolerates it well.  He has no exacerbations  Last JCV Ab was negative 03/06/2019.    His last MRI 10/2017 showed no new lesions and the lesions that enhanced in 2018 looked smaller.    He has one focus in the right cerebral peduncle and one in his cervical spine.   He also had a left hemispheric tumefactive lesion.   I reviewed his MRI 06/05/2019.   It showed multiple T2/flair hyperintense foci in the periventricular,  juxtacortical and deep white matter in a pattern and configuration consistent with chronic demyelinating plaque associated with multiple sclerosis. None of the foci appears to be acute and they do not enhance. They were all present on the MRI dated 11/21/2017.  LAst cervical spine MRi in 2018 showed one focus at C4.     He is noting no issues with his gait or with strength and sensation.   He is working as a Pensions consultant with some physical component.  He has no difficulty climbing stairs or ladders.    Vision is normal.   Bladder function is good.  He denies fatigue.  He is sleeping well most nights.   Mood and cognition are doing well.     Update 05/10/2018:     He has been on Tysabri for over a year.  He has not had any exacerbations.  He tolerates it well.  The JCV antibody titer was 0.3 (negative on inhibition assay).  MRI of the brain March 2019 showed no acute findings.  Several of the foci that enhanced on the previous brain MRI were smaller.  There were no enhancing lesions.  He is doing very well with gait and can climb stairs and ladders.   He plays  basketball and does well but notes if he overheats, he feels weaker and off balanced some.     He stays well hydrated.  He reports the numbness has completely resolved there is no spasticity.  He notes fatigue and sleepiness some days but most days this is mild.   Cognition is doing well.   No mood disturbance.  He gets 4 to 5 hours of sleep straight and then may wake up once or twice after that.  He has a newborn baby boy and wakes up some at night.     Update 11/07/2017:   He has been on Tysabri since about June 2018. He tolerates it well and has not had any exacerbations. His JCV antibody was negative last tested 02/21/2018.  The major symptoms from his multiple sclerosis. Specifically he does not note any problems with his gait. There is no numbness or weakness. He works out periodically. At presentation he had right facial numbness and  slurred speech but those resolved.   There is no difficulty with vision.   Bladder function is fine. . There is no depression or cognitive difficulties.   He notes fatigue but works out regularly and plays basketball.  He is expecting his first child over the summer.   Update 05/26/2017:   After his last visit 3 months ago, he was started on natalizumab. He is JCV antibody negative and he tolerates it well.    He has not had further exacerbations since starting.  He denies any gait issues, numbness and weakness and is back working out in the gym.  The right facial numbness, slurred speech and clumsiness have completely resolved.  He denies fatigue.   His sleep is unchanged with mild insomnia some nights.  He denies any difficulty with mood or cognition.  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 He also noted some mild numbness and clumsiness in the right hand. He did not note any symptoms in the leg. The left side was fine. He had no trouble with his walking. When he returned to [facility] and his symptoms persisted, he presented to the emergency room. An MRI of the brain was performed showing multiple T2/flair hyperintense lesions consistent with MS. Two foci, one larger one in the left centrum semiovale ovale and a smaller one on the right side of the midbrain enhanced consistent with more acute lesions. While in the hospital he also had an MRI of the cervical spine that showed one non-enhancing focus adjacent to C4. He received 5 days of IV steroids. By the time he got to [facility] on 02/15/2017, he was already better compared to couple days earlier and he improved further after the steroids. He currently notes just minimal symptoms in the right arm and improved  problems with numbness and in keeping the mouth tightly closed. In retrospect, he has not noted any other symptoms that could represent an earlier MS exacerbation. He is very active and exercises regularly.  Gait/strength/sensation: He denies any difficulty with his gait. He does not note any more weakness in the right hand. He still notes slight weakness in the right face. His voice is still a bit slurred. He notes some numbness in the right face and tongue.  Bladder function: He denies any urinary frequency, urgency or incontinence.  There is no hesitancy. He does have nocturia once some night especially if he drinks more water.  Vision: He denies any difficulty with visual acuity. He does not note any diplopia and has not had this symptoms of diplopia in the past. At times, in the past he would note some eyestrain.  Fatigue/sleep:   He has not noted any major problems with fatigue. He is sleeping well at night.        Mood/cognition: He denies any difficulties with depression or anxiety. He has not noted any difficulties with cognition.  Other: He was found to have vitamin D deficiency with a level of 17.8 while in the hospital.    I personally reviewed the MRI of the brain and cervical spine from 02/15/2017 and 02/17/2017. The MRI of the brain shows a large enhancing lesion in the centrum semiovale ovale on the left and a small right midbrain lesion that were enhancing consistent with acute MS plaque. There were several other periventricular foci consistent with MS. The MRI of the spinal cord showed a plaque anteriorly at the C4 level.  There is no family history of seizures. Dylan Mccoy had a single seizure when he was talking.    REVIEW OF SYSTEMS: Out of a complete 14 system review of symptoms, the patient complains only of the following symptoms, none and all other reviewed systems are negative.   ALLERGIES: Allergies  Allergen Reactions  . Nickel   . Other     Fragrances      HOME MEDICATIONS: Outpatient Medications Prior to Visit  Medication Sig Dispense Refill  . clobetasol (OLUX) 0.05 % topical foam Apply topically 2 (two) times daily. 150 g 1  . Ferrous Sulfate (IRON PO) Take by mouth.    . natalizumab (TYSABRI) 300 MG/15ML injection Inject 300 mg into the vein every 30 (thirty) days.    Marland Kitchen VITAMIN D PO Take 4,000 Units by mouth daily. (Patient not taking: Reported on 05/25/2020)     No facility-administered medications prior to visit.    PAST MEDICAL HISTORY: Past Medical History:  Diagnosis Date  . Multiple sclerosis (HCC)   . Seizures (HCC)     PAST SURGICAL HISTORY: Past Surgical History:  Procedure Laterality Date  . FRACTURE SURGERY     R elbow, 2nd grade    FAMILY HISTORY: Family History  Problem Relation Age of Onset  . Asthma Mother   . Allergies Mother   . Urticaria Mother   . Healthy Father   . Asthma Brother   . Allergies Brother   . Diabetes Maternal Grandmother   . Diabetes Paternal Grandmother   . Allergic rhinitis Neg Hx   . Angioedema Neg Hx   . Atopy Neg Hx   . Eczema Neg Hx   . Immunodeficiency Neg Hx     SOCIAL HISTORY: Social History   Socioeconomic History  . Marital status: Married    Spouse name: Not on file  . Number of children: Not on file  . Years of education: Not on file  . Highest education level: Not on file  Occupational History  . Not on file  Tobacco Use  . Smoking status: Former Games developer  . Smokeless tobacco: Never Used  Substance and Sexual Activity  . Alcohol use: No  . Drug use: No  . Sexual activity: Yes    Birth control/protection: Pill    Comment: 1 partner last year  Other Topics Concern  . Not on file  Social History Narrative  .  Not on file   Social Determinants of Health   Financial Resource Strain:   . Difficulty of Paying Living Expenses: Not on file  Food Insecurity:   . Worried About Programme researcher, broadcasting/film/video in the Last Year: Not on file  . Ran Out of Food in the  Last Year: Not on file  Transportation Needs:   . Lack of Transportation (Medical): Not on file  . Lack of Transportation (Non-Medical): Not on file  Physical Activity:   . Days of Exercise per Week: Not on file  . Minutes of Exercise per Session: Not on file  Stress:   . Feeling of Stress : Not on file  Social Connections:   . Frequency of Communication with Friends and Family: Not on file  . Frequency of Social Gatherings with Friends and Family: Not on file  . Attends Religious Services: Not on file  . Active Member of Clubs or Organizations: Not on file  . Attends Banker Meetings: Not on file  . Marital Status: Not on file  Intimate Partner Violence:   . Fear of Current or Ex-Partner: Not on file  . Emotionally Abused: Not on file  . Physically Abused: Not on file  . Sexually Abused: Not on file      PHYSICAL EXAM  Vitals:   05/25/20 0825  BP: 123/72  Pulse: 61  Weight: 244 lb (110.7 kg)  Height:  (1.93 m)   Body mass index is 29.7 kg/m.  Generalized: Well developed, in no acute distress  Cardiology: normal rate and rhythm, no murmur noted Respiratory: clear to auscultation bilaterally  Neurological examination  Mentation: Alert oriented to time, place, history taking. Follows all commands speech and language fluent Cranial nerve II-XII: Pupils were equal round reactive to light. Extraocular movements were full, visual field were full on confrontational test. Facial sensation and strength were normal. Head turning and shoulder shrug  were normal and symmetric. Motor: The motor testing reveals 5 over 5 strength of all 4 extremities. Good symmetric motor tone is noted throughout.  Sensory: Sensory testing is intact to soft touch on all 4 extremities. No evidence of extinction is noted.  Coordination: Cerebellar testing reveals good finger-nose-finger and heel-to-shin bilaterally.  Gait and station: Gait is normal.  Reflexes: Deep tendon reflexes are  symmetric and normal bilaterally.    DIAGNOSTIC DATA (LABS, IMAGING, TESTING) - I reviewed patient records, labs, notes, testing and imaging myself where available.  No flowsheet data found.   Lab Results  Component Value Date   WBC 7.4 04/29/2020   HGB 13.1 04/29/2020   HCT 43.3 04/29/2020   MCV 77 (L) 04/29/2020   PLT 170 04/29/2020      Component Value Date/Time   NA 136 02/19/2017 0335   K 3.7 02/19/2017 0335   CL 100 (L) 02/19/2017 0335   CO2 29 02/19/2017 0335   GLUCOSE 143 (H) 02/19/2017 0335   BUN 17 02/19/2017 0335   CREATININE 1.10 02/19/2017 0335   CALCIUM 8.8 (L) 02/19/2017 0335   PROT 8.2 (H) 02/15/2017 1449   ALBUMIN 4.7 02/15/2017 1449   AST 27 02/15/2017 1449   ALT 49 02/15/2017 1449   ALKPHOS 48 02/15/2017 1449   BILITOT 0.8 02/15/2017 1449   GFRNONAA >60 02/19/2017 0335   GFRAA >60 02/19/2017 0335   Lab Results  Component Value Date   CHOL 90 02/16/2017   HDL 22 (L) 02/16/2017   LDLCALC 39 02/16/2017   TRIG 146 02/16/2017  CHOLHDL 4.1 02/16/2017   Lab Results  Component Value Date   HGBA1C 5.0 02/16/2017   No results found for: [id number] Lab Results  Component Value Date   TSH 1.517 02/16/2017       ASSESSMENT AND PLAN 39 y.o. year old male  has a past medical history of Multiple sclerosis (HCC) and Seizures (HCC). here with     ICD-10-CM   1. Relapsing remitting multiple sclerosis (HCC)  G35   2. High risk medication use  Z79.899   3. Vitamin D deficiency  E55.9   4. Other fatigue  R53.83      Dylan Mccoy is doing very well. We will continue Tysabri every 6 weeks. He will return for follow up and repeat labs in 4 months. May switch DMT pending JCV results. He was encouraged to stay well hydrated. Focus on well balanced diet and regular exercise. He will follow up with Dr Epimenio Foot in 6 months. He verbalizes understanding and agreement with this plan.    No orders of the defined types were placed in this encounter.    No orders of the  defined types were placed in this encounter.     I spent 25 minutes with the patient. 50% of this time was spent counseling and educating patient on plan of care and medications.     Shawnie Dapper, FNP-C 05/25/2020, 8:30 AM [facility] [address], [address] [place], [place] [address] [phone]

## 2020-05-21 NOTE — Patient Instructions (Addendum)
We will continue current treatment plan. Please return for labs in 08/2019.   Follow up with Dr Epimenio Foot in 6 months.   Multiple Sclerosis Multiple sclerosis (MS) is a disease of the brain, spinal cord, and optic nerves (central nervous system). It causes the body's disease-fighting (immune) system to destroy the protective covering (myelin sheath) around nerves in the brain. When this happens, signals (nerve impulses) going to and from the brain and spinal cord do not get sent properly or may not get sent at all. There are several types of MS:  Relapsing-remitting MS. This is the most common type. This causes sudden attacks of symptoms. After an attack, you may recover completely until the next attack, or some symptoms may remain permanently.  Secondary progressive MS. This usually develops after the onset of relapsing-remitting MS. Similar to relapsing-remitting MS, this type also causes sudden attacks of symptoms. Attacks may be less frequent, but symptoms slowly get worse (progress) over time.  Primary progressive MS. This causes symptoms that steadily progress over time. This type of MS does not cause sudden attacks of symptoms. The age of onset of MS varies, but it often develops between 36-76 years of age. MS is a lifelong (chronic) condition. There is no cure, but treatment can help slow down the progression of the disease. What are the causes? The cause of this condition is not known. What increases the risk? You are more likely to develop this condition if:  You are a woman.  You have a relative with MS. However, the condition is not passed from parent to child (inherited).  You have a lack (deficiency) of vitamin D.  You smoke. MS is more common in the [place] than in the [place]. What are the signs or symptoms? Relapsing-remitting and secondary progressive MS cause symptoms to occur in episodes or attacks that may last weeks to months. There may be  long periods between attacks in which there are almost no symptoms. Primary progressive MS causes symptoms to steadily progress after they develop. Symptoms of MS vary because of the many different ways it affects the central nervous system. The main symptoms include:  Vision problems and eye pain.  Numbness.  Weakness.  Inability to move your arms, hands, feet, or legs (paralysis).  Balance problems.  Shaking that you cannot control (tremors).  Muscle spasms.  Problems with thinking (cognitive changes). MS can also cause symptoms that are associated with the disease, but are not always the direct result of an MS attack. They may include:  Inability to control urination or bowel movements (incontinence).  Headaches.  Fatigue.  Inability to tolerate heat.  Emotional changes.  Depression.  Pain. How is this diagnosed? This condition is diagnosed based on:  Your symptoms.  A neurological exam. This involves checking central nervous system function, such as nerve function, reflexes, and coordination.  MRIs of the brain and spinal cord.  Lab tests, including a lumbar puncture that tests the fluid that surrounds the brain and spinal cord (cerebrospinal fluid).  Tests to measure the electrical activity of the brain in response to stimulation (evoked potentials). How is this treated? There is no cure for MS, but medicines can help decrease the number and frequency of attacks and help relieve nuisance symptoms. Treatment options may include:  Medicines that reduce the frequency of attacks. These medicines may be given by injection, by mouth (orally), or through an IV.  Medicines that reduce inflammation (steroids). These may provide short-term relief of  symptoms.  Medicines to help control pain, depression, fatigue, or incontinence.  Vitamin D, if you have a deficiency.  Using devices to help you move around (assistive devices), such as braces, a cane, or a  walker.  Physical therapy to strengthen and stretch your muscles.  Occupational therapy to help you with everyday tasks.  Alternative or complementary treatments such as exercise, massage, or acupuncture. Follow these instructions at home:  Take over-the-counter and prescription medicines only as told by your health care provider.  Do not drive or use heavy machinery while taking prescription pain medicine.  Use assistive devices as recommended by your physical therapist or your health care provider.  Exercise as directed by your health care provider.  Return to your normal activities as told by your health care provider. Ask your health care provider what activities are safe for you.  Reach out for support. Share your feelings with friends, family, or a support group.  Keep all follow-up visits as told by your health care provider and therapists. This is important. Where to find more information  National Multiple Sclerosis Society: https://www.nationalmssociety.org Contact a health care provider if:  You feel depressed.  You develop new pain or numbness.  You have tremors.  You have problems with sexual function. Get help right away if:  You develop paralysis.  You develop numbness.  You have problems with your bladder or bowel function.  You develop double vision.  You lose vision in one or both eyes.  You develop suicidal thoughts.  You develop severe confusion. If you ever feel like you may hurt yourself or others, or have thoughts about taking your own life, get help right away. You can go to your nearest emergency department or call:  Your local emergency services (911 in the [place]  A suicide crisis helpline, such as the National Suicide Prevention Lifeline at (480)104-0379. This is open 24 hours a day. Summary  Multiple sclerosis (MS) is a disease of the central nervous system that causes the body's immune system to destroy the protective covering  (myelin sheath) around nerves in the brain.  There are 3 types of MS: relapsing-remitting, secondary progressive, and primary progressive. Relapsing-remitting and secondary progressive MS cause symptoms to occur in episodes or attacks that may last weeks to months. Primary progressive MS causes symptoms to steadily progress after they develop.  There is no cure for MS, but medicines can help decrease the number and frequency of attacks and help relieve nuisance symptoms. Treatment may also include physical or occupational therapy.  If you develop numbness, paralysis, vision problems, or other neurological symptoms, get help right away. This information is not intended to replace advice given to you by your health care provider. Make sure you discuss any questions you have with your health care provider. Document Revised: 07/28/2017 Document Reviewed: 10/24/2016 Elsevier Patient Education  2020 [employer].

## 2020-05-25 ENCOUNTER — Ambulatory Visit (INDEPENDENT_AMBULATORY_CARE_PROVIDER_SITE_OTHER): Payer: BC Managed Care – PPO | Admitting: Family Medicine

## 2020-05-25 ENCOUNTER — Encounter: Payer: Self-pay | Admitting: Family Medicine

## 2020-05-25 ENCOUNTER — Other Ambulatory Visit: Payer: Self-pay

## 2020-05-25 VITALS — BP 123/72 | HR 61 | Ht 76.0 in | Wt 244.0 lb

## 2020-05-25 DIAGNOSIS — E559 Vitamin D deficiency, unspecified: Secondary | ICD-10-CM

## 2020-05-25 DIAGNOSIS — R5383 Other fatigue: Secondary | ICD-10-CM

## 2020-05-25 DIAGNOSIS — G35 Multiple sclerosis: Secondary | ICD-10-CM

## 2020-05-25 DIAGNOSIS — Z79899 Other long term (current) drug therapy: Secondary | ICD-10-CM

## 2020-05-27 NOTE — Progress Notes (Signed)
I have read the note, and I agree with the clinical assessment and plan.  Robi Mitter A. Jahir Halt, MD, PhD, FAAN Certified in Neurology, Clinical Neurophysiology, Sleep Medicine, Pain Medicine and Neuroimaging  [facility] [address], [address] [place], [place] [address] [phone] v 

## 2020-06-04 ENCOUNTER — Other Ambulatory Visit: Payer: Self-pay | Admitting: Neurology

## 2020-06-04 MED ORDER — SILDENAFIL CITRATE 100 MG PO TABS: ORAL_TABLET | ORAL | 11 refills | Status: DC

## 2020-09-09 ENCOUNTER — Other Ambulatory Visit: Payer: Self-pay | Admitting: *Deleted

## 2020-09-09 ENCOUNTER — Telehealth: Payer: Self-pay | Admitting: *Deleted

## 2020-09-09 DIAGNOSIS — Z79899 Other long term (current) drug therapy: Secondary | ICD-10-CM

## 2020-09-09 DIAGNOSIS — G35 Multiple sclerosis: Secondary | ICD-10-CM

## 2020-09-09 NOTE — Telephone Encounter (Signed)
Faxed completed/signed Tysabri pt status report and reauth questionnaire to [facility] touch at [phone]. Received confirmation.   Received fax from touch prescribing program that pt re-auth for Tysabri from 09/09/20-04/08/21. Pt enrollment number: [id number]. Account: [facility]. Site auth number: [id number]

## 2020-10-06 ENCOUNTER — Telehealth: Payer: Self-pay | Admitting: *Deleted

## 2020-10-06 NOTE — Addendum Note (Signed)
Addended by: Tamera Stands D on: 10/06/2020 01:24 PM   Modules accepted: Orders

## 2020-10-06 NOTE — Telephone Encounter (Signed)
JCV to [facility], placed in outbox.

## 2020-10-07 LAB — CBC WITH DIFFERENTIAL/PLATELET
Basophils Absolute: 0 10*3/uL (ref 0.0–0.2)
Basos: 0 %
EOS (ABSOLUTE): 0.2 10*3/uL (ref 0.0–0.4)
Eos: 3 %
Hematocrit: 40 % (ref 37.5–51.0)
Hemoglobin: 12.2 g/dL — ABNORMAL LOW (ref 13.0–17.7)
Immature Grans (Abs): 0 10*3/uL (ref 0.0–0.1)
Immature Granulocytes: 0 %
Lymphocytes Absolute: 4.1 10*3/uL — ABNORMAL HIGH (ref 0.7–3.1)
Lymphs: 61 %
MCH: 23.3 pg — ABNORMAL LOW (ref 26.6–33.0)
MCHC: 30.5 g/dL — ABNORMAL LOW (ref 31.5–35.7)
MCV: 76 fL — ABNORMAL LOW (ref 79–97)
Monocytes Absolute: 0.9 10*3/uL (ref 0.1–0.9)
Monocytes: 13 %
NRBC: 1 % — ABNORMAL HIGH (ref 0–0)
Neutrophils Absolute: 1.5 10*3/uL (ref 1.4–7.0)
Neutrophils: 23 %
Platelets: 168 10*3/uL (ref 150–450)
RBC: 5.24 x10E6/uL (ref 4.14–5.80)
RDW: 14 % (ref 11.6–15.4)
WBC: 6.8 10*3/uL (ref 3.4–10.8)

## 2020-10-15 ENCOUNTER — Telehealth: Payer: Self-pay | Admitting: *Deleted

## 2020-10-15 LAB — STRATIFY JCV AB (W/ INDEX) W/ RFLX
Index Value: 0.51 — ABNORMAL HIGH
Stratify JCV (TM) Ab w/Reflex Inhibition: POSITIVE — AB

## 2020-10-15 NOTE — Telephone Encounter (Signed)
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  This was similar to levels in the past.  Could you please check with Lianne to make sure we are doing his infusions every 6 weeks

## 2020-11-16 ENCOUNTER — Ambulatory Visit (INDEPENDENT_AMBULATORY_CARE_PROVIDER_SITE_OTHER): Payer: BC Managed Care – PPO | Admitting: Neurology

## 2020-11-16 ENCOUNTER — Encounter: Payer: Self-pay | Admitting: Neurology

## 2020-11-16 VITALS — BP 121/68 | HR 45 | Ht 76.0 in | Wt 240.5 lb

## 2020-11-16 DIAGNOSIS — E559 Vitamin D deficiency, unspecified: Secondary | ICD-10-CM | POA: Diagnosis not present

## 2020-11-16 DIAGNOSIS — Z79899 Other long term (current) drug therapy: Secondary | ICD-10-CM | POA: Diagnosis not present

## 2020-11-16 DIAGNOSIS — G35 Multiple sclerosis: Secondary | ICD-10-CM | POA: Diagnosis not present

## 2020-11-16 DIAGNOSIS — R5383 Other fatigue: Secondary | ICD-10-CM

## 2020-11-16 NOTE — Progress Notes (Signed)
[facility]  PATIENT: Dylan Mccoy DOB: 11-Sep-1980  REFERRING DOCTOR OR PCP:  Referred by Dr. Amada Jupiter     SOURCE: Patient, notes from his recent hospital admission, imaging lab reports, MRI images on PACS.  _________________________________   HISTORICAL  CHIEF COMPLAINT:  Chief Complaint  Patient presents with   Follow-up    RM 12, alone. Last seen 05/25/2020 by AL,NP. On Tysabri for MS. Last infusion: 10/06/20, next: 11/17/20. Last JCV 10/06/20: positive, index:0.51    HISTORY OF PRESENT ILLNESS:  Dylan Mccoy is a 40 y.o. man with relapsing remitting multiple sclerosis diagnosed in June 2018  Update 3/221/22 He is doing well on Tysabri and denies any exacerbation or new neurologic symptoms.  He tolerates Tysabri well.  He is JCV antibody low positive with an index of 0.51.  He is currently receiving Tysabri every 6 weeks  Gait , strength and tone are fine.  He can go up and down stairs without holding the bannister.   He plays basketball some and lifts weights .  He is running on a treadmill.   He denies numbness or dysesthesias.   Bladder function is fine.   Vision is doing well.  He denies much fatigue.   He drinks 2 coffees most mornings.    He sleeps well most nights and denies much fatigue.     Mood and cognition are doing well.   He has a 68 year old son and a 51 month old baby.    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  .   He has not yet been vaccinated and is not sure he wants to be.  We discussed pros and cons.    He did the Covid vaccination and one booster and is thinkibg about getting the 4th shot.     MS history:  On 02/10/2017, while vacationing in [place], he woke up with numbness on the right side of his face, his tongue feeling thick and having slurred speech. He also noted some mild numbness and clumsiness in the right hand. He did not note any symptoms in the leg. The  left side was fine. He had no trouble with his walking. When he returned to [facility] and his symptoms persisted, he presented to the emergency room. An MRI of the brain was performed showing multiple T2/flair hyperintense lesions consistent with MS. Two foci, one larger one in the left centrum semiovale ovale and a smaller one on the right side of the midbrain enhanced consistent with more acute lesions. While in the hospital he also had an MRI of the cervical spine that showed one non-enhancing focus adjacent to C4. He received 5 days of IV steroids. By the time he got to [facility] on 02/15/2017, he was already better compared to couple days earlier and he improved further after the steroids.    MRI of the brain and cervical spine from 02/15/2017 and 02/17/2017. The MRI of the brain shows a large enhancing lesion in the centrum semiovale ovale on the left and a small right midbrain lesion that were enhancing consistent with acute MS plaque. There were several other periventricular foci consistent with MS. The MRI of the spinal cord showed a plaque anteriorly at the C4 level.  IMAGING MRI of the brain and cervical spine from 02/15/2017 and 02/17/2017. The MRI of the brain shows a large enhancing lesion in the centrum semiovale ovale on the  left and a small right midbrain lesion that were enhancing consistent with acute MS plaque. There were several other periventricular foci consistent with MS. The MRI of the spinal cord showed a plaque anteriorly at the C4 level.  MRI 06/05/2019 showed multiple T2/flair hyperintense foci in the periventricular, juxtacortical and deep white matter in a pattern and configuration consistent with chronic demyelinating plaque associated with multiple sclerosis.  None of the foci appears to be acute and they do not enhance.  They were all present on the MRI dated 11/21/2017.  LAst cervical spine MRi in 2018 showed one focus at C4.      REVIEW OF SYSTEMS: Constitutional: No  fevers, chills, sweats, or change in appetite Eyes: No visual changes, double vision, eye pain Ear, nose and throat: No hearing loss, ear pain, nasal congestion, sore throat Cardiovascular: No chest pain, palpitations Respiratory: No shortness of breath at rest or with exertion.   No wheezes GastrointestinaI: No nausea, vomiting, diarrhea, abdominal pain, fecal incontinence Genitourinary: No dysuria, urinary retention or frequency.  No nocturia. Musculoskeletal: No neck pain, back pain Integumentary: No rash, pruritus, skin lesions Neurological: as above Psychiatric: No depression at this time.  No anxiety Endocrine: No palpitations, diaphoresis, change in appetite, change in weigh or increased thirst Hematologic/Lymphatic: No anemia, purpura, petechiae. Allergic/Immunologic: No itchy/runny eyes, nasal congestion, recent allergic reactions, rashes  ALLERGIES: Allergies  Allergen Reactions   Nickel    Other     Fragrances    HOME MEDICATIONS:  Current Outpatient Medications:    clobetasol (OLUX) 0.05 % topical foam, Apply topically 2 (two) times daily., Disp: 150 g, Rfl: 1   Ferrous Sulfate (IRON PO), Take by mouth., Disp: , Rfl:    natalizumab (TYSABRI) 300 MG/15ML injection, Inject 300 mg into the vein every 30 (thirty) days., Disp: , Rfl:    sildenafil (VIAGRA) 100 MG tablet, Use 1/2 tab to 1 tab po 30-120 min. before anticipated intercourse, Disp: 10 tablet, Rfl: 11   VITAMIN D PO, Take 4,000 Units by mouth daily., Disp: , Rfl:   PAST MEDICAL HISTORY: Past Medical History:  Diagnosis Date   Multiple sclerosis (HCC)    Seizures (HCC)     PAST SURGICAL HISTORY: Past Surgical History:  Procedure Laterality Date   FRACTURE SURGERY     R elbow, 2nd grade    FAMILY HISTORY: Family History  Problem Relation Age of Onset   Asthma Mother    Allergies Mother    Urticaria Mother    Healthy Father    Asthma Brother    Allergies Brother    Diabetes  Maternal Grandmother    Diabetes Paternal Grandmother    Allergic rhinitis Neg Hx    Angioedema Neg Hx    Atopy Neg Hx    Eczema Neg Hx    Immunodeficiency Neg Hx     SOCIAL HISTORY:  Social History   Socioeconomic History   Marital status: Married    Spouse name: Not on file   Number of children: Not on file   Years of education: Not on file   Highest education level: Not on file  Occupational History   Not on file  Tobacco Use   Smoking status: Former Smoker   Smokeless tobacco: Never Used  Substance and Sexual Activity   Alcohol use: No   Drug use: No   Sexual activity: Yes    Birth control/protection: Pill    Comment: 1 partner last year  Other Topics Concern   Not on  file  Social History Narrative   Not on file   Social Determinants of Health   Financial Resource Strain: Not on file  Food Insecurity: Not on file  Transportation Needs: Not on file  Physical Activity: Not on file  Stress: Not on file  Social Connections: Not on file  Intimate Partner Violence: Not on file     PHYSICAL EXAM  Vitals:   11/16/20 0818  BP: 121/68  Pulse: (!) 45  Weight: 240 lb 8 oz (109.1 kg)  Height: 6\' 4"  (1.93 m)    Body mass index is 29.27 kg/m.   General: The patient is well-developed and well-nourished and in no acute distress  Neurologic Exam  Mental status: The patient is alert and oriented x 3 at the time of the examination. The patient has apparent normal recent and remote memory, with an apparently normal attention span and concentration ability.   Speech is normal in content.  Cranial nerves: Extraocular movements are full.  Symmetric color vision.  Facial strength and sensation is normal. Trapezius strength is normal. Normal speech.   No obvious hearing deficits are noted.  Motor:  Muscle bulk is normal.   Tone is normal. Strength is  5 / 5 in all 4 extremities.   Sensory: he has intact touch, temperature and vibration sensation in  the arms and legs.remities.  Coordination: Cerebellar testing reveals good finger-nose-finger and heel-to-shin bilaterally.  Gait and station: Station is normal.  Gait and tandem gait are normal.  Romberg is negative.  Reflexes: Deep tendon reflexes are symmetric and normal bilaterally in the arms. Knee reflexes are mildly increased but no ankle clonus or spread at knees.    DIAGNOSTIC DATA (LABS, IMAGING, TESTING) - I reviewed patient records, labs, notes, testing and imaging myself where available.  Lab Results  Component Value Date   WBC 6.8 10/06/2020   HGB 12.2 (L) 10/06/2020   HCT 40.0 10/06/2020   MCV 76 (L) 10/06/2020   PLT 168 10/06/2020      Component Value Date/Time   NA 136 02/19/2017 0335   K 3.7 02/19/2017 0335   CL 100 (L) 02/19/2017 0335   CO2 29 02/19/2017 0335   GLUCOSE 143 (H) 02/19/2017 0335   BUN 17 02/19/2017 0335   CREATININE 1.10 02/19/2017 0335   CALCIUM 8.8 (L) 02/19/2017 0335   PROT 8.2 (H) 02/15/2017 1449   ALBUMIN 4.7 02/15/2017 1449   AST 27 02/15/2017 1449   ALT 49 02/15/2017 1449   ALKPHOS 48 02/15/2017 1449   BILITOT 0.8 02/15/2017 1449   GFRNONAA >60 02/19/2017 0335   GFRAA >60 02/19/2017 0335   Lab Results  Component Value Date   CHOL 90 02/16/2017   HDL 22 (L) 02/16/2017   LDLCALC 39 02/16/2017   TRIG 146 02/16/2017   CHOLHDL 4.1 02/16/2017   Lab Results  Component Value Date   HGBA1C 5.0 02/16/2017   No results found for: VITAMINB12 Lab Results  Component Value Date   TSH 1.517 02/16/2017       ASSESSMENT AND PLAN  Multiple sclerosis (HCC) - Plan: MR BRAIN WO CONTRAST, MR CERVICAL SPINE WO CONTRAST, Stratify JCV Antibody Test ([facility]), CBC with Differential/Platelet  High risk medication use - Plan: Stratify JCV Antibody Test ([facility]), CBC with Differential/Platelet  Vitamin D deficiency  Other fatigue   1.   Continue Tysabri 300 mg every 6 weeks.  We will check an MRI of the brain and cervical spine to  determine if there has been any  subclinical progression.  If present, consider a different disease modifying therapy. 2.   He has had vit D deficiency. Continue Vit D 5000 units daily.   Check level and supplement further if low 3.   Stay active and exercises as tolerated. Return in 6 months or sooner if there are new or worsening neurologic symptoms.  Standley Bargo A. Epimenio Foot, MD, [facility] 11/16/2020, 8:02 PM Certified in Neurology, Clinical Neurophysiology, Sleep Medicine, Pain Medicine and Neuroimaging  [facility] [address], [address] [place], [place] [address] [phone]

## 2020-11-17 ENCOUNTER — Telehealth: Payer: Self-pay | Admitting: Neurology

## 2020-11-17 NOTE — Telephone Encounter (Signed)
LVM for pt to call back about scheduling mri  [employer] auth: NPR spoke to [facility] Ref # [id number]

## 2020-12-29 ENCOUNTER — Telehealth: Payer: Self-pay | Admitting: *Deleted

## 2020-12-29 ENCOUNTER — Telehealth: Payer: Self-pay | Admitting: Neurology

## 2020-12-29 NOTE — Addendum Note (Signed)
Addended by: Tamera Stands D on: 12/29/2020 03:05 PM   Modules accepted: Orders

## 2020-12-29 NOTE — Telephone Encounter (Signed)
Placed JCV lab in [facility] lock box for routine lab pick up. Results pending. 

## 2020-12-29 NOTE — Telephone Encounter (Signed)
MRI Brain wo contrast & MR cervical wo 60 mins Dr. Epimenio Foot answered no to covid questions sched w patient in office JM 12/29/20  Scheduled at [facility]

## 2020-12-30 LAB — CBC WITH DIFFERENTIAL/PLATELET
Basophils Absolute: 0 10*3/uL (ref 0.0–0.2)
Basos: 0 %
EOS (ABSOLUTE): 0.1 10*3/uL (ref 0.0–0.4)
Eos: 2 %
Hematocrit: 40.9 % (ref 37.5–51.0)
Hemoglobin: 12.3 g/dL — ABNORMAL LOW (ref 13.0–17.7)
Immature Grans (Abs): 0 10*3/uL (ref 0.0–0.1)
Immature Granulocytes: 0 %
Lymphocytes Absolute: 2.6 10*3/uL (ref 0.7–3.1)
Lymphs: 51 %
MCH: 23.2 pg — ABNORMAL LOW (ref 26.6–33.0)
MCHC: 30.1 g/dL — ABNORMAL LOW (ref 31.5–35.7)
MCV: 77 fL — ABNORMAL LOW (ref 79–97)
Monocytes Absolute: 0.5 10*3/uL (ref 0.1–0.9)
Monocytes: 9 %
NRBC: 1 % — ABNORMAL HIGH (ref 0–0)
Neutrophils Absolute: 2 10*3/uL (ref 1.4–7.0)
Neutrophils: 38 %
Platelets: 147 10*3/uL — ABNORMAL LOW (ref 150–450)
RBC: 5.3 x10E6/uL (ref 4.14–5.80)
RDW: 14.1 % (ref 11.6–15.4)
WBC: 5.1 10*3/uL (ref 3.4–10.8)

## 2021-01-05 NOTE — Telephone Encounter (Signed)
JCV ab drawn on 12/29/20 positive, index: 0.66. Gave to MD to review.

## 2021-01-06 ENCOUNTER — Other Ambulatory Visit: Payer: Self-pay

## 2021-01-06 ENCOUNTER — Ambulatory Visit (INDEPENDENT_AMBULATORY_CARE_PROVIDER_SITE_OTHER): Payer: BC Managed Care – PPO

## 2021-01-06 DIAGNOSIS — G35 Multiple sclerosis: Secondary | ICD-10-CM

## 2021-02-28 ENCOUNTER — Telehealth: Payer: Self-pay | Admitting: Neurology

## 2021-02-28 NOTE — Telephone Encounter (Signed)
On Thursday, 02/25/2021, I spoke with [facility] Rx management [phone].  The pharmacy clinical reviewer apparently did not have authority to preauthorize.  I set up a peer to peer for 02/26/2021 and spoke with Dr. Elie Mccoy.  I reviewed his case with her including his initial aggressive presentation, that his MS has been stable while on Tysabri and that his MRIs have been stable.  He is low positive JCV antibody.  I discussed the AAN guidelines (DMT 2018) that he is an appropriate person to continue Tysabri therapy (starting recommendation #14 and #16 and switching recommendation #6)  Unfortunately, she could not let me know the decision but that we would be receiving a communication within a week.

## 2021-05-04 ENCOUNTER — Other Ambulatory Visit: Payer: Self-pay | Admitting: Neurology

## 2021-05-04 ENCOUNTER — Telehealth: Payer: Self-pay | Admitting: Neurology

## 2021-05-04 ENCOUNTER — Other Ambulatory Visit: Payer: BC Managed Care – PPO

## 2021-05-04 DIAGNOSIS — G35 Multiple sclerosis: Secondary | ICD-10-CM

## 2021-05-04 DIAGNOSIS — Z79899 Other long term (current) drug therapy: Secondary | ICD-10-CM

## 2021-05-04 NOTE — Telephone Encounter (Signed)
Placed JCV lab in [facility] lock box for routine lab pick up. Results pending. 

## 2021-05-05 LAB — CBC WITH DIFFERENTIAL/PLATELET
Basophils Absolute: 0.1 10*3/uL (ref 0.0–0.2)
Basos: 1 %
EOS (ABSOLUTE): 0.2 10*3/uL (ref 0.0–0.4)
Eos: 3 %
Hematocrit: 41.8 % (ref 37.5–51.0)
Hemoglobin: 12.8 g/dL — ABNORMAL LOW (ref 13.0–17.7)
Immature Grans (Abs): 0 10*3/uL (ref 0.0–0.1)
Immature Granulocytes: 0 %
Lymphocytes Absolute: 3.5 10*3/uL — ABNORMAL HIGH (ref 0.7–3.1)
Lymphs: 53 %
MCH: 23.4 pg — ABNORMAL LOW (ref 26.6–33.0)
MCHC: 30.6 g/dL — ABNORMAL LOW (ref 31.5–35.7)
MCV: 76 fL — ABNORMAL LOW (ref 79–97)
Monocytes Absolute: 0.6 10*3/uL (ref 0.1–0.9)
Monocytes: 9 %
NRBC: 1 % — ABNORMAL HIGH (ref 0–0)
Neutrophils Absolute: 2.2 10*3/uL (ref 1.4–7.0)
Neutrophils: 34 %
Platelets: 174 10*3/uL (ref 150–450)
RBC: 5.48 x10E6/uL (ref 4.14–5.80)
RDW: 13.7 % (ref 11.6–15.4)
WBC: 6.6 10*3/uL (ref 3.4–10.8)

## 2021-05-12 NOTE — Telephone Encounter (Signed)
JCV antibody result was positive and index value is 0.77.  This is increased from May labs. Will pass along to Dr Epimenio Foot for review.

## 2021-05-20 NOTE — Progress Notes (Addendum)
PATIENT: Dylan Mccoy DOB: 1981-02-09  REASON FOR VISIT: follow up HISTORY FROM: patient  Chief Complaint  Patient presents with   Follow-up    Rm 10, alone. Here for 6 month MS f/u, on Tysabri. Last infusion: 05/04/21 and Next infusion: 06/15/2021.       HISTORY OF PRESENT ILLNESS: 05/24/21 Dylan Mccoy is a 40 y.o. male here today for follow up for RRMS on Tysabri.every 6 weeks due to positive JCV (0.51 on 10/06/2020). Planning to recheck today. MRI stable in 12/2020.   He is doing well, today. No new or exacerbating symptoms. Gait stable. No falls. No changes in bowel or bladder habits. He feels vision is normal. Mood is good. He has alternating sleep schedule. 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 He is taking vitamin D most every day.    HISTORY: (copied from Dr Bonnita Hollow note on 11/21/2019)  Dylan Mccoy is a 40 y.o. man with relapsing remitting multiple sclerosis diagnosed in June 2018   Update 3/221/22 He is doing well on Tysabri and denies any exacerbation or new neurologic symptoms.  He tolerates Tysabri well.  He is JCV antibody low positive with an index of 0.51.  He is currently receiving Tysabri every 6 weeks   Gait , strength and tone are fine.  He can go up and down stairs without holding the bannister.   He plays basketball some and lifts weights .  He is running on a treadmill.   He denies numbness or dysesthesias.   Bladder function is fine.   Vision is doing well.   He denies much fatigue.   He drinks 2 coffees most mornings.    He sleeps well most nights and denies much fatigue.     Mood and cognition are doing well.   He has a 71 year old son and a 50 month old baby.     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  .   He has not yet been vaccinated and is not sure he wants to be.  We discussed pros and cons.    He did the Covid vaccination and one booster and is  thinkibg about getting the 4th shot.     MS history:  On 02/10/2017, while vacationing in [place], he woke up with numbness on the right side of his face, his tongue feeling thick and having slurred speech. He also noted some mild numbness and clumsiness in the right hand. He did not note any symptoms in the leg. The left side was fine. He had no trouble with his walking. When he returned to [facility] and his symptoms persisted, he presented to the emergency room. An MRI of the brain was performed showing multiple T2/flair hyperintense lesions consistent with MS. Two foci, one larger one in the left centrum semiovale ovale and a smaller one on the right side of the midbrain enhanced consistent with more acute lesions. While in the hospital he also had an MRI of the cervical spine that showed one non-enhancing focus adjacent to C4. He received 5 days of IV steroids. By the time he got to [facility] on 02/15/2017, he was already better compared to couple days earlier and he improved further after the steroids.    MRI of the brain and cervical spine from 02/15/2017 and 02/17/2017. The MRI of the brain shows a  large enhancing lesion in the centrum semiovale ovale on the left and a small right midbrain lesion that were enhancing consistent with acute MS plaque. There were several other periventricular foci consistent with MS. The MRI of the spinal cord showed a plaque anteriorly at the C4 level.   IMAGING MRI of the brain and cervical spine from 02/15/2017 and 02/17/2017. The MRI of the brain shows a large enhancing lesion in the centrum semiovale ovale on the left and a small right midbrain lesion that were enhancing consistent with acute MS plaque. There were several other periventricular foci consistent with MS. The MRI of the spinal cord showed a plaque anteriorly at the C4 level.   MRI 06/05/2019 showed multiple T2/flair hyperintense foci in the periventricular, juxtacortical and deep white matter in a  pattern and configuration consistent with chronic demyelinating plaque associated with multiple sclerosis.  None of the foci appears to be acute and they do not enhance.  They were all present on the MRI dated 11/21/2017.  LAst cervical spine MRi in 2018 showed one focus at C4.    REVIEW OF SYSTEMS: Out of a complete 14 system review of symptoms, the patient complains only of the following symptoms, none and all other reviewed systems are negative.   ALLERGIES: Allergies  Allergen Reactions   Nickel    Other     Fragrances    HOME MEDICATIONS: Outpatient Medications Prior to Visit  Medication Sig Dispense Refill   clobetasol (OLUX) 0.05 % topical foam Apply topically 2 (two) times daily. 150 g 1   Ferrous Sulfate (IRON PO) Take by mouth.     natalizumab (TYSABRI) 300 MG/15ML injection Inject 300 mg into the vein every 30 (thirty) days.     VITAMIN D PO Take 4,000 Units by mouth daily.     sildenafil (VIAGRA) 100 MG tablet Use 1/2 tab to 1 tab po 30-120 min. before anticipated intercourse 10 tablet 11   No facility-administered medications prior to visit.    PAST MEDICAL HISTORY: Past Medical History:  Diagnosis Date   Multiple sclerosis (HCC)    Seizures (HCC)     PAST SURGICAL HISTORY: Past Surgical History:  Procedure Laterality Date   FRACTURE SURGERY     R elbow, 2nd grade    FAMILY HISTORY: Family History  Problem Relation Age of Onset   Asthma Mother    Allergies Mother    Urticaria Mother    Healthy Father    Asthma Brother    Allergies Brother    Diabetes Maternal Grandmother    Diabetes Paternal Grandmother    Allergic rhinitis Neg Hx    Angioedema Neg Hx    Atopy Neg Hx    Eczema Neg Hx    Immunodeficiency Neg Hx     SOCIAL HISTORY: Social History   Socioeconomic History   Marital status: Married    Spouse name: Not on file   Number of children: Not on file   Years of education: Not on file   Highest education level: Not on file  Occupational  History   Not on file  Tobacco Use   Smoking status: Former   Smokeless tobacco: Never  Substance and Sexual Activity   Alcohol use: No   Drug use: No   Sexual activity: Yes    Birth control/protection: Pill    Comment: 1 partner last year  Other Topics Concern   Not on file  Social History Narrative   Not on file   Social Determinants  of Health   Financial Resource Strain: Not on file  Food Insecurity: Not on file  Transportation Needs: Not on file  Physical Activity: Not on file  Stress: Not on file  Social Connections: Not on file  Intimate Partner Violence: Not on file      PHYSICAL EXAM  Vitals:   05/24/21 0833  BP: 112/70  Pulse: (!) 48  Weight: 238 lb (108 kg)  Height: 6\' 4"  (1.93 m)    Body mass index is 28.97 kg/m.  Generalized: Well developed, in no acute distress  Cardiology: normal rate and rhythm, no murmur noted Respiratory: clear to auscultation bilaterally  Neurological examination  Mentation: Alert oriented to time, place, history taking. Follows all commands speech and language fluent Cranial nerve II-XII: Pupils were equal round reactive to light. Extraocular movements were full, visual field were full on confrontational test. Facial sensation and strength were normal. Head turning and shoulder shrug  were normal and symmetric. Motor: The motor testing reveals 5 over 5 strength of all 4 extremities. Good symmetric motor tone is noted throughout.  Sensory: Sensory testing is intact to soft touch on all 4 extremities. No evidence of extinction is noted.  Coordination: Cerebellar testing reveals good finger-nose-finger and heel-to-shin bilaterally.  Gait and station: Gait is normal.  Reflexes: Deep tendon reflexes are symmetric and normal bilaterally.    DIAGNOSTIC DATA (LABS, IMAGING, TESTING) - I reviewed patient records, labs, notes, testing and imaging myself where available.  No flowsheet data found.   Lab Results  Component Value Date    WBC 6.6 05/04/2021   HGB 12.8 (L) 05/04/2021   HCT 41.8 05/04/2021   MCV 76 (L) 05/04/2021   PLT 174 05/04/2021      Component Value Date/Time   NA 136 02/19/2017 0335   K 3.7 02/19/2017 0335   CL 100 (L) 02/19/2017 0335   CO2 29 02/19/2017 0335   GLUCOSE 143 (H) 02/19/2017 0335   BUN 17 02/19/2017 0335   CREATININE 1.10 02/19/2017 0335   CALCIUM 8.8 (L) 02/19/2017 0335   PROT 8.2 (H) 02/15/2017 1449   ALBUMIN 4.7 02/15/2017 1449   AST 27 02/15/2017 1449   ALT 49 02/15/2017 1449   ALKPHOS 48 02/15/2017 1449   BILITOT 0.8 02/15/2017 1449   GFRNONAA >60 02/19/2017 0335   GFRAA >60 02/19/2017 0335   Lab Results  Component Value Date   CHOL 90 02/16/2017   HDL 22 (L) 02/16/2017   LDLCALC 39 02/16/2017   TRIG 146 02/16/2017   CHOLHDL 4.1 02/16/2017   Lab Results  Component Value Date   HGBA1C 5.0 02/16/2017   No results found for: 02/18/2017 Lab Results  Component Value Date   TSH 1.517 02/16/2017       ASSESSMENT AND PLAN 40 y.o. year old male  has a past medical history of Multiple sclerosis (HCC) and Seizures (HCC). here with     ICD-10-CM   1. Relapsing remitting multiple sclerosis (HCC)  G35 Stratify JCV Ab (w/ Index) w/ Rflx    CBC with Differential/Platelets    2. High risk medication use  Z79.899 Stratify JCV Ab (w/ Index) w/ Rflx    CBC with Differential/Platelets    3. Vitamin D deficiency  E55.9 Vitamin D, 25-hydroxy        Dylan Mccoy is doing very well. We will continue Tysabri every 6 weeks. We will update labs, today. May switch DMT pending JCV results. He was encouraged to stay well hydrated. Focus on well balanced diet  and regular exercise. He will follow up with Dr Epimenio Foot in 6 months. He verbalizes understanding and agreement with this plan.    Orders Placed This Encounter  Procedures   Stratify JCV Ab (w/ Index) w/ Rflx   CBC with Differential/Platelets   Vitamin D, 25-hydroxy      Meds ordered this encounter  Medications    sildenafil (VIAGRA) 100 MG tablet    Sig: Use 1/2 tab to 1 tab po 30-120 min. before anticipated intercourse    Dispense:  10 tablet    Refill:  11    Order Specific Question:   Supervising Provider    Answer:   Anson Fret [[id number]]       SFK CLEXN, FNP-C 05/24/2021, 8:56 AM [facility] [address] [place], [place] [address] [phone]   I have read the note, and I agree with the clinical assessment and plan.  Richard A. Epimenio Foot, MD, PhD, [facility] Certified in Neurology, Clinical Neurophysiology, Sleep Medicine, Pain Medicine and Neuroimaging  [facility] [address] [place], [place] [address] [phone]

## 2021-05-20 NOTE — Patient Instructions (Signed)
Below is our plan:  We will continue Tysabri every 6 weeks. Continue Viagra as needed.   Please make sure you are staying well hydrated. I recommend 50-60 ounces daily. Well balanced diet and regular exercise encouraged. Consistent sleep schedule with 6-8 hours recommended.   Please continue follow up with care team as directed.   Follow up with Dr Epimenio Foot in 6 months  You may receive a survey regarding today's visit. I encourage you to leave honest feed back as I do use this information to improve patient care. Thank you for seeing me today!

## 2021-05-24 ENCOUNTER — Ambulatory Visit (INDEPENDENT_AMBULATORY_CARE_PROVIDER_SITE_OTHER): Payer: BC Managed Care – PPO | Admitting: Family Medicine

## 2021-05-24 ENCOUNTER — Encounter: Payer: Self-pay | Admitting: Family Medicine

## 2021-05-24 ENCOUNTER — Telehealth: Payer: Self-pay | Admitting: *Deleted

## 2021-05-24 VITALS — BP 112/70 | HR 48 | Ht 76.0 in | Wt 238.0 lb

## 2021-05-24 DIAGNOSIS — Z79899 Other long term (current) drug therapy: Secondary | ICD-10-CM

## 2021-05-24 DIAGNOSIS — E559 Vitamin D deficiency, unspecified: Secondary | ICD-10-CM | POA: Diagnosis not present

## 2021-05-24 DIAGNOSIS — G35 Multiple sclerosis: Secondary | ICD-10-CM | POA: Diagnosis not present

## 2021-05-24 MED ORDER — SILDENAFIL CITRATE 100 MG PO TABS: ORAL_TABLET | ORAL | 11 refills | Status: DC

## 2021-05-24 NOTE — Telephone Encounter (Signed)
Placed JCV lab in [facility] lock box for routine lab pick up. Results pending. 

## 2021-05-25 LAB — CBC WITH DIFFERENTIAL/PLATELET
Basophils Absolute: 0.1 10*3/uL (ref 0.0–0.2)
Basos: 1 %
EOS (ABSOLUTE): 0.2 10*3/uL (ref 0.0–0.4)
Eos: 2 %
Hematocrit: 42.1 % (ref 37.5–51.0)
Hemoglobin: 12.6 g/dL — ABNORMAL LOW (ref 13.0–17.7)
Immature Grans (Abs): 0 10*3/uL (ref 0.0–0.1)
Immature Granulocytes: 0 %
Lymphocytes Absolute: 3.8 10*3/uL — ABNORMAL HIGH (ref 0.7–3.1)
Lymphs: 56 %
MCH: 22.7 pg — ABNORMAL LOW (ref 26.6–33.0)
MCHC: 29.9 g/dL — ABNORMAL LOW (ref 31.5–35.7)
MCV: 76 fL — ABNORMAL LOW (ref 79–97)
Monocytes Absolute: 0.9 10*3/uL (ref 0.1–0.9)
Monocytes: 13 %
NRBC: 1 % — ABNORMAL HIGH (ref 0–0)
Neutrophils Absolute: 1.9 10*3/uL (ref 1.4–7.0)
Neutrophils: 28 %
Platelets: 144 10*3/uL — ABNORMAL LOW (ref 150–450)
RBC: 5.54 x10E6/uL (ref 4.14–5.80)
RDW: 13.9 % (ref 11.6–15.4)
WBC: 6.9 10*3/uL (ref 3.4–10.8)

## 2021-05-25 LAB — VITAMIN D 25 HYDROXY (VIT D DEFICIENCY, FRACTURES): Vit D, 25-Hydroxy: 60.9 ng/mL (ref 30.0–100.0)

## 2021-05-31 NOTE — Telephone Encounter (Signed)
Called [facility] at [phone] per AL,NP request to get update on test results for JCV. Account: [id number]. Spoke w/ Sherry Ruffing. Test still pending. Can take 1-14 days for test results to come back.

## 2021-06-02 NOTE — Telephone Encounter (Signed)
JCV Antibody index value is 0.71 with positive antibody.  Receives Tysabri every 6 weeks. Last draw in may was 0.66 with positive index.   Will send this information to Dr Epimenio Foot and Amy.

## 2021-06-03 NOTE — Telephone Encounter (Signed)
Called the patient and made him aware there were no changes to the treatment plan. Pt verbalized understanding. Pt had no questions at this time but was encouraged to call back if questions arise.

## 2021-07-27 ENCOUNTER — Telehealth: Payer: Self-pay | Admitting: Neurology

## 2021-07-27 ENCOUNTER — Other Ambulatory Visit (INDEPENDENT_AMBULATORY_CARE_PROVIDER_SITE_OTHER): Payer: Self-pay

## 2021-07-27 ENCOUNTER — Other Ambulatory Visit: Payer: Self-pay | Admitting: Neurology

## 2021-07-27 DIAGNOSIS — G35 Multiple sclerosis: Secondary | ICD-10-CM

## 2021-07-27 DIAGNOSIS — Z0289 Encounter for other administrative examinations: Secondary | ICD-10-CM

## 2021-07-27 DIAGNOSIS — Z79899 Other long term (current) drug therapy: Secondary | ICD-10-CM

## 2021-07-27 NOTE — Telephone Encounter (Signed)
Placed JCV lab in [facility] lock box for routine lab pick up. Results pending. 

## 2021-07-28 LAB — CBC WITH DIFFERENTIAL/PLATELET
Basophils Absolute: 0 10*3/uL (ref 0.0–0.2)
Basos: 0 %
EOS (ABSOLUTE): 0.2 10*3/uL (ref 0.0–0.4)
Eos: 3 %
Hematocrit: 41.5 % (ref 37.5–51.0)
Hemoglobin: 12.8 g/dL — ABNORMAL LOW (ref 13.0–17.7)
Immature Grans (Abs): 0 10*3/uL (ref 0.0–0.1)
Immature Granulocytes: 0 %
Lymphocytes Absolute: 4.7 10*3/uL — ABNORMAL HIGH (ref 0.7–3.1)
Lymphs: 60 %
MCH: 23.2 pg — ABNORMAL LOW (ref 26.6–33.0)
MCHC: 30.8 g/dL — ABNORMAL LOW (ref 31.5–35.7)
MCV: 75 fL — ABNORMAL LOW (ref 79–97)
Monocytes Absolute: 0.7 10*3/uL (ref 0.1–0.9)
Monocytes: 10 %
NRBC: 1 % — ABNORMAL HIGH (ref 0–0)
Neutrophils Absolute: 2.1 10*3/uL (ref 1.4–7.0)
Neutrophils: 27 %
Platelets: 164 10*3/uL (ref 150–450)
RBC: 5.51 x10E6/uL (ref 4.14–5.80)
RDW: 13.8 % (ref 11.6–15.4)
WBC: 7.7 10*3/uL (ref 3.4–10.8)

## 2021-08-02 NOTE — Telephone Encounter (Signed)
JCV results have came back. Index value was 0.96 (H) Antibody is positive.  This is up from the previous drawn in September that was 0.71. pt is on tysabri every 6 wks. Will inform Dr Epimenio Foot and have him review.

## 2021-08-02 NOTE — Telephone Encounter (Signed)
Called the patient back and advised that at this time the JCV antibody results increased again and that Dr Epimenio Foot thinks it is best that we get him worked in to discuss alternative treatment medications.  Pt agreed to come in on wed morning at 10:30 with check in 10 am.

## 2021-08-04 ENCOUNTER — Encounter: Payer: Self-pay | Admitting: Neurology

## 2021-08-04 ENCOUNTER — Other Ambulatory Visit: Payer: Self-pay

## 2021-08-04 ENCOUNTER — Ambulatory Visit (INDEPENDENT_AMBULATORY_CARE_PROVIDER_SITE_OTHER): Payer: BC Managed Care – PPO | Admitting: Neurology

## 2021-08-04 ENCOUNTER — Other Ambulatory Visit: Payer: Self-pay | Admitting: Neurology

## 2021-08-04 VITALS — BP 135/82 | HR 55 | Ht 76.0 in | Wt 248.5 lb

## 2021-08-04 DIAGNOSIS — Z79899 Other long term (current) drug therapy: Secondary | ICD-10-CM

## 2021-08-04 DIAGNOSIS — R5383 Other fatigue: Secondary | ICD-10-CM | POA: Diagnosis not present

## 2021-08-04 DIAGNOSIS — E559 Vitamin D deficiency, unspecified: Secondary | ICD-10-CM

## 2021-08-04 DIAGNOSIS — G35 Multiple sclerosis: Secondary | ICD-10-CM

## 2021-08-04 NOTE — Progress Notes (Signed)
[facility]  PATIENT: Dylan Mccoy DOB: 14-Oct-1980  REFERRING DOCTOR OR PCP:  Referred by Dr. Amada Jupiter     SOURCE: Patient, notes from his recent hospital admission, imaging lab reports, MRI images on PACS.  _________________________________   HISTORICAL  CHIEF COMPLAINT:  Chief Complaint  Patient presents with   Follow-up    RM 1, alone. Last seen 05/24/21. MS-On Tysabri q 6wks. Last infusion date: 11/29/222 Next infusion date: 09/14/2021. Tolerating infusions well, no issues.    HISTORY OF PRESENT ILLNESS:  Dylan Mccoy is a 40 y.o. man with relapsing remitting multiple sclerosis diagnosed in June 2018  Update 08/04/2021 He is doing well on Tysabri and denies any exacerbation or new neurologic symptoms.  He tolerates Tysabri well.  Unfortunately he converted from JCV antibody low positive with an index of 0.51 to a middle positive index of 0.97.   We discussed options.  .  He was receiving Tysabri every 6 weeks   He just had his Tysabri last week and is scheduled 09/14/2021  Gait , strength and tone are fine.  He can go up and down stairs without holding the bannister.   He plays basketball some and lifts weights .  He is running on a treadmill.   He denies numbness or dysesthesias.   Bladder function is fine.   Vision is doing well.  He has some fatigue but notes having 2 young children.   He drinks 2 coffees most mornings with benefit.    He sleeps well most nights.   Mood and cognition are doing well.   He has a 68 year old son and a 32-year-old baby.    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  .   He has not yet been vaccinated and is not sure he wants to be.  We discussed pros and cons.    He did the Covid vaccination and one booster and is thinking about getting the 4th shot.      MS history:  On 02/10/2017, while vacationing in [place], he woke up with numbness on the right side of his  face, his tongue feeling thick and having slurred speech. He also noted some mild numbness and clumsiness in the right hand. He did not note any symptoms in the leg. The left side was fine. He had no trouble with his walking. When he returned to [facility] and his symptoms persisted, he presented to the emergency room. An MRI of the brain was performed showing multiple T2/flair hyperintense lesions consistent with MS. Two foci, one larger one in the left centrum semiovale ovale and a smaller one on the right side of the midbrain enhanced consistent with more acute lesions. While in the hospital he also had an MRI of the cervical spine that showed one non-enhancing focus adjacent to C4. He received 5 days of IV steroids. By the time he got to [facility] on 02/15/2017, he was already better compared to couple days earlier and he improved further after the steroids.    MRI of the brain and cervical spine from 02/15/2017 and 02/17/2017. The MRI of the brain shows a large enhancing lesion in the centrum semiovale ovale on the left and a small right midbrain lesion that were enhancing consistent with acute MS plaque. There were several other periventricular foci consistent with MS. The MRI of the spinal cord showed a plaque anteriorly at the C4 level.  IMAGING MRI of the brain and cervical spine from 02/15/2017 and 02/17/2017. The MRI of the brain shows a large enhancing lesion in the centrum semiovale ovale on the left and a small right midbrain lesion that were enhancing consistent with acute MS plaque. There were several other periventricular foci consistent with MS. The MRI of the spinal cord showed a plaque anteriorly at the C4 level.  MRI 06/05/2019 showed multiple T2/flair hyperintense foci in the periventricular, juxtacortical and deep white matter in a pattern and configuration consistent with chronic demyelinating plaque associated with multiple sclerosis.  None of the foci appears to be acute and they do  not enhance.  They were all present on the MRI dated 11/21/2017.  LAst cervical spine MRi in 2018 showed one focus at C4.      REVIEW OF SYSTEMS: Constitutional: No fevers, chills, sweats, or change in appetite Eyes: No visual changes, double vision, eye pain Ear, nose and throat: No hearing loss, ear pain, nasal congestion, sore throat Cardiovascular: No chest pain, palpitations Respiratory:  No shortness of breath at rest or with exertion.   No wheezes GastrointestinaI: No nausea, vomiting, diarrhea, abdominal pain, fecal incontinence Genitourinary:  No dysuria, urinary retention or frequency.  No nocturia. Musculoskeletal:  No neck pain, back pain Integumentary: No rash, pruritus, skin lesions Neurological: as above Psychiatric: No depression at this time.  No anxiety Endocrine: No palpitations, diaphoresis, change in appetite, change in weigh or increased thirst Hematologic/Lymphatic:  No anemia, purpura, petechiae. Allergic/Immunologic: No itchy/runny eyes, nasal congestion, recent allergic reactions, rashes  ALLERGIES: Allergies  Allergen Reactions   Nickel    Other     Fragrances    HOME MEDICATIONS:  Current Outpatient Medications:    clobetasol (OLUX) 0.05 % topical foam, Apply topically 2 (two) times daily., Disp: 150 g, Rfl: 1   Ferrous Sulfate (IRON PO), Take by mouth., Disp: , Rfl:    natalizumab (TYSABRI) 300 MG/15ML injection, Inject 300 mg into the vein every 30 (thirty) days., Disp: , Rfl:    sildenafil (VIAGRA) 100 MG tablet, Use 1/2 tab to 1 tab po 30-120 min. before anticipated intercourse, Disp: 10 tablet, Rfl: 11   VITAMIN D PO, Take 4,000 Units by mouth daily., Disp: , Rfl:   PAST MEDICAL HISTORY: Past Medical History:  Diagnosis Date   Multiple sclerosis (HCC)    Seizures (HCC)     PAST SURGICAL HISTORY: Past Surgical History:  Procedure Laterality Date   FRACTURE SURGERY     R elbow, 2nd grade    FAMILY HISTORY: Family History  Problem  Relation Age of Onset   Asthma Mother    Allergies Mother    Urticaria Mother    Healthy Father    Asthma Brother    Allergies Brother    Diabetes Maternal Grandmother    Diabetes Paternal Grandmother    Allergic rhinitis Neg Hx    Angioedema Neg Hx    Atopy Neg Hx    Eczema Neg Hx    Immunodeficiency Neg Hx     SOCIAL HISTORY:  Social History   Socioeconomic History   Marital status: Married    Spouse name: Not on file   Number of children: Not on file   Years of education: Not on file   Highest education level: Not on file  Occupational History   Not on file  Tobacco Use   Smoking status: Former   Smokeless tobacco: Never  Substance and Sexual Activity   Alcohol use: No  Drug use: No   Sexual activity: Yes    Birth control/protection: Pill    Comment: 1 partner last year  Other Topics Concern   Not on file  Social History Narrative   Not on file   Social Determinants of Health   Financial Resource Strain: Not on file  Food Insecurity: Not on file  Transportation Needs: Not on file  Physical Activity: Not on file  Stress: Not on file  Social Connections: Not on file  Intimate Partner Violence: Not on file     PHYSICAL EXAM  Vitals:   08/04/21 1011  BP: 135/82  Pulse: (!) 55  SpO2: 96%  Weight: 248 lb 8 oz (112.7 kg)  Height: 6\' 4"  (1.93 m)    Body mass index is 30.25 kg/m.   General: The patient is well-developed and well-nourished and in no acute distress  Neurologic Exam  Mental status: The patient is alert and oriented x 3 at the time of the examination. The patient has apparent normal recent and remote memory, with an apparently normal attention span and concentration ability.   Speech is normal in content.  Cranial nerves: Extraocular movements are full.  Color vision is symmetric.  Facial strength and sensation is normal. Trapezius strength is normal. Normal speech.   No obvious hearing deficits are noted.  Motor:  Muscle bulk is  normal.   Tone is normal. Strength is  5 / 5 in all 4 extremities.   Sensory: he has intact touch, temperature and vibration sensation in the arms and legs.remities.  Coordination: Cerebellar testing reveals good finger-nose-finger and heel-to-shin bilaterally.  Gait and station: Station is normal.  The gait and tandem gait are normal.  Romberg is negative.  Reflexes: Deep tendon reflexes are symmetric and normal bilaterally in the arms. Knee reflexes are mildly increased but no ankle clonus or spread at knees.    DIAGNOSTIC DATA (LABS, IMAGING, TESTING) - I reviewed patient records, labs, notes, testing and imaging myself where available.  Lab Results  Component Value Date   WBC 7.7 07/27/2021   HGB 12.8 (L) 07/27/2021   HCT 41.5 07/27/2021   MCV 75 (L) 07/27/2021   PLT 164 07/27/2021      Component Value Date/Time   NA 136 02/19/2017 0335   K 3.7 02/19/2017 0335   CL 100 (L) 02/19/2017 0335   CO2 29 02/19/2017 0335   GLUCOSE 143 (H) 02/19/2017 0335   BUN 17 02/19/2017 0335   CREATININE 1.10 02/19/2017 0335   CALCIUM 8.8 (L) 02/19/2017 0335   PROT 8.2 (H) 02/15/2017 1449   ALBUMIN 4.7 02/15/2017 1449   AST 27 02/15/2017 1449   ALT 49 02/15/2017 1449   ALKPHOS 48 02/15/2017 1449   BILITOT 0.8 02/15/2017 1449   GFRNONAA >60 02/19/2017 0335   GFRAA >60 02/19/2017 0335   Lab Results  Component Value Date   CHOL 90 02/16/2017   HDL 22 (L) 02/16/2017   LDLCALC 39 02/16/2017   TRIG 146 02/16/2017   CHOLHDL 4.1 02/16/2017   Lab Results  Component Value Date   HGBA1C 5.0 02/16/2017   No results found for: VITAMINB12 Lab Results  Component Value Date   TSH 1.517 02/16/2017       ASSESSMENT AND PLAN  Multiple sclerosis (HCC) - Plan: QuantiFERON-TB Gold Plus, Hepatitis B surface antigen, Hepatitis B core antibody, total, HIV Antibody (routine testing w rflx), Hepatitis C antibody, Hepatitis B surface antibody,qualitative, Hepatic function panel, IgG, IgA, IgM, MR  BRAIN WO CONTRAST  High risk medication use - Plan: QuantiFERON-TB Gold Plus, Hepatitis B surface antigen, Hepatitis B core antibody, total, HIV Antibody (routine testing w rflx), Hepatitis C antibody, Hepatitis B surface antibody,qualitative, Hepatic function panel, IgG, IgA, IgM, MR BRAIN WO CONTRAST  Other fatigue  Vitamin D deficiency   1.   Due to converting from low to middle positive JCV Ab status, we will switch to an ani CD20 agent.  We discussed Ocrevus and will check labs for chronic infecions and MRI to make sure no evidence of PML before the switch.    2.   He has had vit D deficiency. Continue Vit D 5000 units daily.   Check level and supplement further if low 3.   Stay active and exercises as tolerated.  Return in 6 months or sooner if there are new or worsening neurologic symptoms.   Macario Shear A. Epimenio Foot, MD, [facility] 08/04/2021, 11:08 AM Certified in Neurology, Clinical Neurophysiology, Sleep Medicine, Pain Medicine and Neuroimaging  [facility] [address], [address] [place], [place] [address] [phone]

## 2021-08-05 LAB — SPECIMEN STATUS REPORT

## 2021-08-09 LAB — QUANTIFERON-TB GOLD PLUS
QuantiFERON Mitogen Value: >10 IU/mL
QuantiFERON Nil Value: 0.05 IU/mL
QuantiFERON TB1 Ag Value: 0.06 IU/mL
QuantiFERON TB2 Ag Value: 0.07 IU/mL
QuantiFERON-TB Gold Plus: NEGATIVE

## 2021-08-09 LAB — IGG, IGA, IGM
IgA/Immunoglobulin A, Serum: 329 mg/dL (ref 90–386)
IgG (Immunoglobin G), Serum: 1446 mg/dL (ref 603–1613)
IgM (Immunoglobulin M), Srm: 49 mg/dL (ref 20–172)

## 2021-08-09 LAB — HEPATIC FUNCTION PANEL
ALT: 31 IU/L (ref 0–44)
AST: 23 IU/L (ref 0–40)
Albumin: 4.6 g/dL (ref 4.0–5.0)
Alkaline Phosphatase: 48 IU/L (ref 44–121)
Bilirubin Total: 0.2 mg/dL (ref 0.0–1.2)
Bilirubin, Direct: <0.1 mg/dL (ref 0.00–0.40)
Total Protein: 7.5 g/dL (ref 6.0–8.5)

## 2021-08-09 LAB — HEPATITIS B SURFACE ANTIGEN: Hepatitis B Surface Ag: NEGATIVE

## 2021-08-09 LAB — HEPATITIS B SURFACE ANTIBODY,QUALITATIVE: Hep B Surface Ab, Qual: REACTIVE

## 2021-08-09 LAB — HIV ANTIBODY (ROUTINE TESTING W REFLEX): HIV Screen 4th Generation wRfx: NONREACTIVE

## 2021-08-09 LAB — HEPATITIS B CORE ANTIBODY, TOTAL: Hep B Core Total Ab: NEGATIVE

## 2021-08-09 LAB — HEPATITIS C ANTIBODY: Hep C Virus Ab: 0.3 s/co ratio (ref 0.0–0.9)

## 2021-08-11 ENCOUNTER — Ambulatory Visit (INDEPENDENT_AMBULATORY_CARE_PROVIDER_SITE_OTHER): Payer: BC Managed Care – PPO

## 2021-08-11 ENCOUNTER — Telehealth: Payer: Self-pay | Admitting: Neurology

## 2021-08-11 DIAGNOSIS — G35 Multiple sclerosis: Secondary | ICD-10-CM

## 2021-08-11 DIAGNOSIS — Z79899 Other long term (current) drug therapy: Secondary | ICD-10-CM

## 2021-08-11 NOTE — Telephone Encounter (Signed)
MR Brain wo contrast Dr. Gershon Mussel auth: NPR Ref # [id number]. Patient is scheduled at [facility] for 08/11/21.

## 2021-08-12 ENCOUNTER — Encounter: Payer: Self-pay | Admitting: Neurology

## 2021-08-16 ENCOUNTER — Telehealth: Payer: Self-pay

## 2021-08-16 NOTE — Telephone Encounter (Signed)
-----   Message from Asa Lente, MD sent at 08/11/2021  7:28 PM EST ----- Labs are fine.  We can send in the form for Ocrevus if it has not already been done.

## 2021-08-16 NOTE — Telephone Encounter (Signed)
I called patient. I advised him that his labs were fine and we will proceed with Ocrevus. Patient is agreeable to this plan.  Ocrevus start form faxed to [facility]. Received a receipt of confirmation.  Ocrevus start form & order with clinical info given to [facility] for processing.  Will continue to follow.

## 2021-10-05 ENCOUNTER — Encounter: Payer: Self-pay | Admitting: *Deleted

## 2021-10-05 NOTE — Telephone Encounter (Signed)
Patient's first Ocrevus 300mg  infusion was 09/20/2021.

## 2021-11-22 ENCOUNTER — Ambulatory Visit: Payer: BC Managed Care – PPO | Admitting: Neurology

## 2022-02-08 ENCOUNTER — Ambulatory Visit (INDEPENDENT_AMBULATORY_CARE_PROVIDER_SITE_OTHER): Payer: BC Managed Care – PPO | Admitting: Neurology

## 2022-02-08 ENCOUNTER — Encounter: Payer: Self-pay | Admitting: Neurology

## 2022-02-08 VITALS — BP 140/70 | HR 75 | Ht 76.0 in | Wt 238.0 lb

## 2022-02-08 DIAGNOSIS — G35 Multiple sclerosis: Secondary | ICD-10-CM

## 2022-02-08 DIAGNOSIS — R5383 Other fatigue: Secondary | ICD-10-CM | POA: Diagnosis not present

## 2022-02-08 DIAGNOSIS — Z79899 Other long term (current) drug therapy: Secondary | ICD-10-CM | POA: Diagnosis not present

## 2022-02-08 NOTE — Progress Notes (Signed)
[facility]  PATIENT: Dylan Mccoy DOB: [birth year]  REFERRING DOCTOR OR PCP:  Referred by Dr. Amada Jupiter     SOURCE: Patient, notes from his recent hospital admission, imaging lab reports, MRI images on PACS.  _________________________________   HISTORICAL  CHIEF COMPLAINT:  Chief Complaint  Patient presents with   Follow-up    Pt alone, rm 2. Presents for MS f/u. DMT Ocrevus and last infusion was 10/04/2021 and due 04/04/2022 for next. Overall stable    HISTORY OF PRESENT ILLNESS:  Dylan Mccoy is a 41 y.o. man with relapsing remitting multiple sclerosis diagnosed in June 2018  Update 02/08/2022 He switched to Ocrevus due to converting to middle positive JCV Ab status.     He had his spit dose February 2023 and next dose is August 2023.   He tolerated the infusions well.    MRI 08/11/2021 showed no new lesions  Gait, strength and tone are fine.  He can go up and down stairs without holding the bannister.   He is very physically active and plays basketball some and lifts weights .  He runs on a treadmill.   He denies numbness or dysesthesias.   Bladder function is fine.   Vision is doing well.  He has some fatigue but has 2 young children.   He has swing shift so sleep is variable.   He feels good with 6 hours of sleep.    Mood and cognition are doing well.     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  He has two kids, almost 40 year old son and an almos 41-year-old boy.      MS history:  On 02/10/2017, while vacationing in [place], he woke up with numbness on the right side of his face, his tongue feeling thick and having slurred speech. He also noted some mild numbness and clumsiness in the right hand. He did not note any symptoms in the leg. The left side was fine. He had no trouble with his walking. When he returned to [facility] and his symptoms persisted, he presented to the emergency room. An  MRI of the brain was performed showing multiple T2/flair hyperintense lesions consistent with MS. Two foci, one larger one in the left centrum semiovale ovale and a smaller one on the right side of the midbrain enhanced consistent with more acute lesions. While in the hospital he also had an MRI of the cervical spine that showed one non-enhancing focus adjacent to C4. He received 5 days of IV steroids. By the time he got to [facility] on 02/15/2017, he was already better compared to couple days earlier and he improved further after the steroids.    MRI of the brain and cervical spine from 02/15/2017 and 02/17/2017. The MRI of the brain shows a large enhancing lesion in the centrum semiovale ovale on the left and a small right midbrain lesion that were enhancing consistent with acute MS plaque. There were several other periventricular foci consistent with MS. The MRI of the spinal cord showed a plaque anteriorly at the C4 level.  IMAGING MRI of the brain 08/11/2021 shows T2/FLAIR hyperintense foci in the hemispheres, predominantly in the periventricular white matter.  The pattern is consistent with chronic demyelinating plaque associated with multiple sclerosis.  None of the foci appear to be acute.  Compared to the MRI from 01/06/2021, there are no new lesions.  MRI of the  brain and cervical spine 01/06/2021 shows a stable pattern of demyelinating plaques in the hemisphere unchanged compared to the 06/05/2019 MRI.  The cervical spine shows a focus adjacent to C4 anteriorly consistent with chronic demyelination.  There are mild degenerative changes that are stable and do not lead to significant spinal stenosis or foraminal narrowing.  MRI of the brain and cervical spine from 02/15/2017 and 02/17/2017. The MRI of the brain shows a large enhancing lesion in the centrum semiovale ovale on the left and a small right midbrain lesion that were enhancing consistent with acute MS plaque. There were several other  periventricular foci consistent with MS. The MRI of the spinal cord showed a plaque anteriorly at the C4 level.  MRI 06/05/2019 showed multiple T2/flair hyperintense foci in the periventricular, juxtacortical and deep white matter in a pattern and configuration consistent with chronic demyelinating plaque associated with multiple sclerosis.  None of the foci appears to be acute and they do not enhance.  They were all present on the MRI dated 11/21/2017.  LAst cervical spine MRi in 2018 showed one focus at C4.      REVIEW OF SYSTEMS: Constitutional: No fevers, chills, sweats, or change in appetite Eyes: No visual changes, double vision, eye pain Ear, nose and throat: No hearing loss, ear pain, nasal congestion, sore throat Cardiovascular: No chest pain, palpitations Respiratory:  No shortness of breath at rest or with exertion.   No wheezes GastrointestinaI: No nausea, vomiting, diarrhea, abdominal pain, fecal incontinence Genitourinary:  No dysuria, urinary retention or frequency.  No nocturia. Musculoskeletal:  No neck pain, back pain Integumentary: No rash, pruritus, skin lesions Neurological: as above Psychiatric: No depression at this time.  No anxiety Endocrine: No palpitations, diaphoresis, change in appetite, change in weigh or increased thirst Hematologic/Lymphatic:  No anemia, purpura, petechiae. Allergic/Immunologic: No itchy/runny eyes, nasal congestion, recent allergic reactions, rashes  ALLERGIES: Allergies  Allergen Reactions   Nickel    Other     Fragrances    HOME MEDICATIONS:  Current Outpatient Medications:    Ferrous Sulfate (IRON PO), Take by mouth., Disp: , Rfl:    Ocrelizumab (OCREVUS IV), Inject 600 mg into the vein every 6 (six) months., Disp: , Rfl:    sildenafil (VIAGRA) 100 MG tablet, Use 1/2 tab to 1 tab po 30-120 min. before anticipated intercourse, Disp: 10 tablet, Rfl: 11   VITAMIN D PO, Take 4,000 Units by mouth daily., Disp: , Rfl:   PAST MEDICAL  HISTORY: Past Medical History:  Diagnosis Date   Multiple sclerosis (HCC)    Seizures (HCC)     PAST SURGICAL HISTORY: Past Surgical History:  Procedure Laterality Date   FRACTURE SURGERY     R elbow, 2nd grade    FAMILY HISTORY: Family History  Problem Relation Age of Onset   Asthma Mother    Allergies Mother    Urticaria Mother    Healthy Father    Asthma Brother    Allergies Brother    Diabetes Maternal Grandmother    Diabetes Paternal Grandmother    Allergic rhinitis Neg Hx    Angioedema Neg Hx    Atopy Neg Hx    Eczema Neg Hx    Immunodeficiency Neg Hx     SOCIAL HISTORY:  Social History   Socioeconomic History   Marital status: Married    Spouse name: Not on file   Number of children: Not on file   Years of education: Not on file   Highest education level:  Not on file  Occupational History   Not on file  Tobacco Use   Smoking status: Former   Smokeless tobacco: Never  Substance and Sexual Activity   Alcohol use: No   Drug use: No   Sexual activity: Yes    Birth control/protection: Pill    Comment: 1 partner last year  Other Topics Concern   Not on file  Social History Narrative   Not on file   Social Determinants of Health   Financial Resource Strain: Not on file  Food Insecurity: Not on file  Transportation Needs: Not on file  Physical Activity: Not on file  Stress: Not on file  Social Connections: Not on file  Intimate Partner Violence: Not on file     PHYSICAL EXAM  Vitals:   02/08/22 1052  BP: 140/70  Pulse: 75  Weight: 238 lb (108 kg)  Height: 6\' 4"  (1.93 m)    Body mass index is 28.97 kg/m.   General: The patient is well-developed and well-nourished and in no acute distress  Neurologic Exam  Mental status: The patient is alert and oriented x 3 at the time of the examination. The patient has apparent normal recent and remote memory, with an apparently normal attention span and concentration ability.   Speech is normal  in content.  Cranial nerves: Extraocular movements are full.  Color vision is symmetric.  Facial strength and sensation is normal. Trapezius strength is normal. Normal speech.   No obvious hearing deficits are noted.  Motor:  Muscle bulk is normal.   Tone is normal. Strength is  5 / 5 in all 4 extremities.   Sensory: he has intact touch, temperature and vibration sensation in the arms and legs.remities.  Coordination: Cerebellar testing reveals good finger-nose-finger and heel-to-shin bilaterally.  Gait and station: Station is normal.  The gait and tandem gait are normal.  Romberg is negative.  Reflexes: Deep tendon reflexes are symmetric and normal bilaterally in the arms. His DTRs are mildly elevated at the knees.  No ankle clonus    DIAGNOSTIC DATA (LABS, IMAGING, TESTING) - I reviewed patient records, labs, notes, testing and imaging myself where available.  Lab Results  Component Value Date   WBC 7.7 07/27/2021   HGB 12.8 (L) 07/27/2021   HCT 41.5 07/27/2021   MCV 75 (L) 07/27/2021   PLT 164 07/27/2021      Component Value Date/Time   NA 136 02/19/2017 0335   K 3.7 02/19/2017 0335   CL 100 (L) 02/19/2017 0335   CO2 29 02/19/2017 0335   GLUCOSE 143 (H) 02/19/2017 0335   BUN 17 02/19/2017 0335   CREATININE 1.10 02/19/2017 0335   CALCIUM 8.8 (L) 02/19/2017 0335   PROT 7.5 08/04/2021 0000   ALBUMIN 4.6 08/04/2021 0000   AST 23 08/04/2021 0000   ALT 31 08/04/2021 0000   ALKPHOS 48 08/04/2021 0000   BILITOT 0.2 08/04/2021 0000   GFRNONAA >60 02/19/2017 0335   GFRAA >60 02/19/2017 0335   Lab Results  Component Value Date   CHOL 90 02/16/2017   HDL 22 (L) 02/16/2017   LDLCALC 39 02/16/2017   TRIG 146 02/16/2017   CHOLHDL 4.1 02/16/2017   Lab Results  Component Value Date   HGBA1C 5.0 02/16/2017   No results found for: "VITAMINB12" Lab Results  Component Value Date   TSH 1.517 02/16/2017       ASSESSMENT AND PLAN  Multiple sclerosis (HCC)  High risk  medication use  Other fatigue  1.   Continue Ocrevus.  Next visit, check CBC/D and IgG/IgM.   Consider repeat MRI end of year.    2.   Continue to be active.   Continue Vit D 5000 units daily.   Check level and supplement further if low 3.   Stay active and exercises as tolerated. 4.   Return in 6 months or sooner if there are new or worsening neurologic symptoms.   Graciella Arment A. Epimenio Foot, MD, [facility] 02/08/2022, 11:01 AM Certified in Neurology, Clinical Neurophysiology, Sleep Medicine, Pain Medicine and Neuroimaging  [facility] [address] [place], [place] [address] [phone]

## 2022-06-03 ENCOUNTER — Encounter: Payer: Self-pay | Admitting: Neurology

## 2022-06-06 ENCOUNTER — Other Ambulatory Visit: Payer: Self-pay | Admitting: *Deleted

## 2022-06-06 MED ORDER — SILDENAFIL CITRATE 100 MG PO TABS: ORAL_TABLET | ORAL | 8 refills | Status: DC

## 2022-08-17 ENCOUNTER — Ambulatory Visit (INDEPENDENT_AMBULATORY_CARE_PROVIDER_SITE_OTHER): Payer: BC Managed Care – PPO | Admitting: Neurology

## 2022-08-17 ENCOUNTER — Encounter: Payer: Self-pay | Admitting: Neurology

## 2022-08-17 VITALS — BP 138/65 | HR 60 | Ht 76.0 in | Wt 252.0 lb

## 2022-08-17 DIAGNOSIS — Z79899 Other long term (current) drug therapy: Secondary | ICD-10-CM

## 2022-08-17 DIAGNOSIS — G35 Multiple sclerosis: Secondary | ICD-10-CM | POA: Diagnosis not present

## 2022-08-17 DIAGNOSIS — R5383 Other fatigue: Secondary | ICD-10-CM

## 2022-08-17 DIAGNOSIS — R200 Anesthesia of skin: Secondary | ICD-10-CM | POA: Diagnosis not present

## 2022-08-17 NOTE — Progress Notes (Signed)
[facility]  PATIENT: Dylan Mccoy DOB: 1980/12/28  REFERRING DOCTOR OR PCP:  Referred by Dr. Leonel Ramsay     SOURCE: Patient, notes from his recent hospital admission, imaging lab reports, MRI images on PACS.  _________________________________   HISTORICAL  CHIEF COMPLAINT:  Chief Complaint  Patient presents with   Follow-up    Pt in room #10 and alone. Pt here today to f/u with his MS.    HISTORY OF PRESENT ILLNESS:  Dylan Mccoy is a 41 y.o. man with relapsing remitting multiple sclerosis diagnosed in June 2018  Update 08/17/2022 He is on Ocrevus (was Tysabri) due to converting to middle positive JCV Ab status.     He had his split dose February 2023 and second  dose is August 2023.   Next dose is February 2023.  He tolerated the infusions well.      MRI 08/11/2021 showed no new lesions.   Also has a focus at C4 in the spinal cord.        Gait, strength and tone are fine.  He can go up and down stairs without holding the bannister.   He is very physically active and plays basketball some and lifts weights.  He jogs some.    He denies numbness or dysesthesias.   Bladder function is fine.   Vision is doing well.  He has some fatigue which is more of a problem in the heat.    He has swing shift so sleep is variable - he likes as he has mor etim during the day.   He feels good with 6 hours of sleep.    Mood and cognition are doing well.     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  He has two kids, 34 year old boy and an 70-year-old boy.     MS history:  On 02/10/2017, while vacationing in [place], he woke up with numbness on the right side of his face, his tongue feeling thick and having slurred speech. He also noted some mild numbness and clumsiness in the right hand. He did not note any symptoms in the leg. The left side was fine. He had no trouble with his walking. When he returned to  [facility] and his symptoms persisted, he presented to the emergency room. An MRI of the brain was performed showing multiple T2/flair hyperintense lesions consistent with MS. Two foci, one larger one in the left centrum semiovale ovale and a smaller one on the right side of the midbrain enhanced consistent with more acute lesions. While in the hospital he also had an MRI of the cervical spine that showed one non-enhancing focus adjacent to C4. He received 5 days of IV steroids. By the time he got to [facility] on 02/15/2017, he was already better compared to couple days earlier and he improved further after the steroids.    MRI of the brain and cervical spine from 02/15/2017 and 02/17/2017. The MRI of the brain shows a large enhancing lesion in the centrum semiovale ovale on the left and a small right midbrain lesion that were enhancing consistent with acute MS plaque. There were several other periventricular foci consistent with MS. The MRI of the spinal cord showed a plaque anteriorly at the C4 level.  IMAGING MRI of the brain 08/11/2021 shows T2/FLAIR hyperintense foci in the hemispheres, predominantly in the periventricular white matter.  The pattern is consistent with  chronic demyelinating plaque associated with multiple sclerosis.  None of the foci appear to be acute.  Compared to the MRI from 01/06/2021, there are no new lesions.  MRI of the brain and cervical spine 01/06/2021 shows a stable pattern of demyelinating plaques in the hemisphere unchanged compared to the 06/05/2019 MRI.  The cervical spine shows a focus adjacent to C4 anteriorly consistent with chronic demyelination.  There are mild degenerative changes that are stable and do not lead to significant spinal stenosis or foraminal narrowing.  MRI of the brain and cervical spine from 02/15/2017 and 02/17/2017. The MRI of the brain shows a large enhancing lesion in the centrum semiovale ovale on the left and a small right midbrain lesion that  were enhancing consistent with acute MS plaque. There were several other periventricular foci consistent with MS. The MRI of the spinal cord showed a plaque anteriorly at the C4 level.  MRI 06/05/2019 showed multiple T2/flair hyperintense foci in the periventricular, juxtacortical and deep white matter in a pattern and configuration consistent with chronic demyelinating plaque associated with multiple sclerosis.  None of the foci appears to be acute and they do not enhance.  They were all present on the MRI dated 11/21/2017.  LAst cervical spine MRi in 2018 showed one focus at C4.      REVIEW OF SYSTEMS: Constitutional: No fevers, chills, sweats, or change in appetite Eyes: No visual changes, double vision, eye pain Ear, nose and throat: No hearing loss, ear pain, nasal congestion, sore throat Cardiovascular: No chest pain, palpitations Respiratory:  No shortness of breath at rest or with exertion.   No wheezes GastrointestinaI: No nausea, vomiting, diarrhea, abdominal pain, fecal incontinence Genitourinary:  No dysuria, urinary retention or frequency.  No nocturia. Musculoskeletal:  No neck pain, back pain Integumentary: No rash, pruritus, skin lesions Neurological: as above Psychiatric: No depression at this time.  No anxiety Endocrine: No palpitations, diaphoresis, change in appetite, change in weigh or increased thirst Hematologic/Lymphatic:  No anemia, purpura, petechiae. Allergic/Immunologic: No itchy/runny eyes, nasal congestion, recent allergic reactions, rashes  ALLERGIES: Allergies  Allergen Reactions   Nickel    Other     Fragrances    HOME MEDICATIONS:  Current Outpatient Medications:    Ferrous Sulfate (IRON PO), Take by mouth., Disp: , Rfl:    Ocrelizumab (OCREVUS IV), Inject 600 mg into the vein every 6 (six) months., Disp: , Rfl:    sildenafil (VIAGRA) 100 MG tablet, Use 1/2 tab to 1 tab po 30-120 min. before anticipated intercourse, Disp: 10 tablet, Rfl: 8   VITAMIN  D PO, Take 4,000 Units by mouth daily., Disp: , Rfl:   PAST MEDICAL HISTORY: Past Medical History:  Diagnosis Date   Multiple sclerosis (HCC)    Seizures (HCC)     PAST SURGICAL HISTORY: Past Surgical History:  Procedure Laterality Date   FRACTURE SURGERY     R elbow, 2nd grade    FAMILY HISTORY: Family History  Problem Relation Age of Onset   Asthma Mother    Allergies Mother    Urticaria Mother    Healthy Father    Asthma Brother    Allergies Brother    Diabetes Maternal Grandmother    Diabetes Paternal Grandmother    Allergic rhinitis Neg Hx    Angioedema Neg Hx    Atopy Neg Hx    Eczema Neg Hx    Immunodeficiency Neg Hx     SOCIAL HISTORY:  Social History   Socioeconomic History  Marital status: Married    Spouse name: Not on file   Number of children: Not on file   Years of education: Not on file   Highest education level: Not on file  Occupational History   Not on file  Tobacco Use   Smoking status: Former   Smokeless tobacco: Never  Substance and Sexual Activity   Alcohol use: No   Drug use: No   Sexual activity: Yes    Birth control/protection: Pill    Comment: 1 partner last year  Other Topics Concern   Not on file  Social History Narrative   Not on file   Social Determinants of Health   Financial Resource Strain: Not on file  Food Insecurity: Not on file  Transportation Needs: Not on file  Physical Activity: Not on file  Stress: Not on file  Social Connections: Not on file  Intimate Partner Violence: Not on file     PHYSICAL EXAM  Vitals:   08/17/22 1127  BP: 138/65  Pulse: 60  Weight: 252 lb (114.3 kg)  Height: 6\' 4"  (1.93 m)    Body mass index is 30.67 kg/m.   General: The patient is well-developed and well-nourished and in no acute distress  Neurologic Exam  Mental status: The patient is alert and oriented x 3 at the time of the examination. The patient has apparent normal recent and remote memory, with an  apparently normal attention span and concentration ability.   Speech is normal in content.  Cranial nerves: Extraocular movements are full.  Color vision is symmetric.  Facial strength and sensation is normal. Trapezius strength is normal. Normal speech.   No obvious hearing deficits are noted.  Motor:  Muscle bulk is normal.   Tone is normal. Strength is  5 / 5 in all 4 extremities.   Sensory: he has intact touch, temperature and vibration sensation in the arms and legs.remities.  Coordination: Finger-nose-finger and heel-to-shin is performed well.  Gait and station: Station is normal.  The gait and tandem gait are normal.  Romberg is negative.  Reflexes: Deep tendon reflexes are symmetric and normal bilaterally in the arms. His DTRs are mildly elevated at the knees.  He does not have ankle clonus.    DIAGNOSTIC DATA (LABS, IMAGING, TESTING) - I reviewed patient records, labs, notes, testing and imaging myself where available.  Lab Results  Component Value Date   WBC 7.7 07/27/2021   HGB 12.8 (L) 07/27/2021   HCT 41.5 07/27/2021   MCV 75 (L) 07/27/2021   PLT 164 07/27/2021      Component Value Date/Time   NA 136 02/19/2017 0335   K 3.7 02/19/2017 0335   CL 100 (L) 02/19/2017 0335   CO2 29 02/19/2017 0335   GLUCOSE 143 (H) 02/19/2017 0335   BUN 17 02/19/2017 0335   CREATININE 1.10 02/19/2017 0335   CALCIUM 8.8 (L) 02/19/2017 0335   PROT 7.5 08/04/2021 0000   ALBUMIN 4.6 08/04/2021 0000   AST 23 08/04/2021 0000   ALT 31 08/04/2021 0000   ALKPHOS 48 08/04/2021 0000   BILITOT 0.2 08/04/2021 0000   GFRNONAA >60 02/19/2017 0335   GFRAA >60 02/19/2017 0335   Lab Results  Component Value Date   CHOL 90 02/16/2017   HDL 22 (L) 02/16/2017   LDLCALC 39 02/16/2017   TRIG 146 02/16/2017   CHOLHDL 4.1 02/16/2017   Lab Results  Component Value Date   HGBA1C 5.0 02/16/2017   No results found for: "VITAMINB12" Lab  Results  Component Value Date   TSH 1.517 02/16/2017        ASSESSMENT AND PLAN  Multiple sclerosis ([place]) - Plan: IgG, IgA, IgM, CD20 B Cells  High risk medication use - Plan: IgG, IgA, IgM, CD20 B Cells  Numbness  Other fatigue   1.   Continue Ocrevus.  We will check lab.  Around the time the next visit he will need to have an MRI of the brain check to determine if there is any breakthrough activity. 2.   Continue to be active.   Continue Vit D 5000 units daily.    3.    Return in 6 months or sooner if there are new or worsening neurologic symptoms.   Elbia Paro A. Felecia Shelling, MD, [facility] [id number], [id number] AM Certified in Neurology, Clinical Neurophysiology, Sleep Medicine, Pain Medicine and Neuroimaging  [facility] [address], [place], [place] [address] [phone]

## 2022-08-18 LAB — CD20 B CELLS
% CD19-B Cells: 0 % — ABNORMAL LOW (ref 4.6–22.1)
% CD20-B Cells: 0 % — ABNORMAL LOW (ref 5.0–22.3)

## 2022-08-18 LAB — IGG, IGA, IGM
IgA/Immunoglobulin A, Serum: 348 mg/dL (ref 90–386)
IgG (Immunoglobin G), Serum: 1362 mg/dL (ref 603–1613)
IgM (Immunoglobulin M), Srm: 38 mg/dL (ref 20–172)

## 2022-08-29 HISTORY — PX: KNEE SURGERY: SHX244

## 2022-09-01 ENCOUNTER — Emergency Department (HOSPITAL_BASED_OUTPATIENT_CLINIC_OR_DEPARTMENT_OTHER): Payer: BC Managed Care – PPO | Admitting: Radiology

## 2022-09-01 ENCOUNTER — Encounter (HOSPITAL_BASED_OUTPATIENT_CLINIC_OR_DEPARTMENT_OTHER): Payer: Self-pay | Admitting: Emergency Medicine

## 2022-09-01 ENCOUNTER — Emergency Department (HOSPITAL_BASED_OUTPATIENT_CLINIC_OR_DEPARTMENT_OTHER)
Admission: EM | Admit: 2022-09-01 | Discharge: 2022-09-01 | Disposition: A | Discharge status: Home / Self Care | Payer: BC Managed Care – PPO | Attending: Emergency Medicine | Admitting: Emergency Medicine

## 2022-09-01 ENCOUNTER — Other Ambulatory Visit (HOSPITAL_BASED_OUTPATIENT_CLINIC_OR_DEPARTMENT_OTHER): Payer: Self-pay

## 2022-09-01 ENCOUNTER — Other Ambulatory Visit: Payer: Self-pay

## 2022-09-01 DIAGNOSIS — Y9367 Activity, basketball: Secondary | ICD-10-CM | POA: Insufficient documentation

## 2022-09-01 DIAGNOSIS — X501XXA Overexertion from prolonged static or awkward postures, initial encounter: Secondary | ICD-10-CM | POA: Insufficient documentation

## 2022-09-01 DIAGNOSIS — M7989 Other specified soft tissue disorders: Secondary | ICD-10-CM | POA: Diagnosis not present

## 2022-09-01 DIAGNOSIS — S86011A Strain of right Achilles tendon, initial encounter: Secondary | ICD-10-CM | POA: Insufficient documentation

## 2022-09-01 DIAGNOSIS — S99911A Unspecified injury of right ankle, initial encounter: Secondary | ICD-10-CM | POA: Diagnosis not present

## 2022-09-01 DIAGNOSIS — S8991XA Unspecified injury of right lower leg, initial encounter: Secondary | ICD-10-CM | POA: Diagnosis not present

## 2022-09-01 MED ORDER — OXYCODONE-ACETAMINOPHEN 5-325 MG PO TABS
1.0000 | ORAL_TABLET | Freq: Three times a day (TID) | ORAL | 0 refills | Status: DC | PRN
  Filled 2022-09-01: qty 6, 2d supply, fill #0

## 2022-09-01 MED ORDER — OXYCODONE-ACETAMINOPHEN 5-325 MG PO TABS: 1.0000 | ORAL_TABLET | Freq: Once | ORAL | Status: DC

## 2022-09-01 MED ORDER — IBUPROFEN 600 MG PO TABS
600.0000 mg | ORAL_TABLET | Freq: Four times a day (QID) | ORAL | 0 refills | Status: DC | PRN
  Filled 2022-09-01: qty 30, 8d supply, fill #0

## 2022-09-01 MED ORDER — NAPROXEN 250 MG PO TABS: 500.0000 mg | ORAL_TABLET | Freq: Once | ORAL | Status: DC

## 2022-09-01 NOTE — Discharge Instructions (Addendum)
You are seen in the ER for your orthopedic injury.  X-ray of your ankle does not show any fracture.  You likely have an Achilles tendon rupture.  Please follow-up with the orthopedic doctor by calling the number provided.  Take the medications prescribed.  You will be nonweightbearing status until cleared by orthopedic surgery, therefore use the crutches at all times when moving.

## 2022-09-01 NOTE — ED Triage Notes (Signed)
Sharp pain in right achilles during basketball game

## 2022-09-01 NOTE — ED Provider Notes (Signed)
[facility] EMERGENCY DEPT Provider Note   CSN: [id number] Arrival date & time: 09/01/22  1213     History  Chief Complaint  Patient presents with   Leg Injury    Dylan Mccoy is a 42 y.o. male.  HPI     42 year old male comes in with chief complaint of leg injury.  Patient states that he was playing basketball, and he had pivoted when he heard a pop and had immediate pain over his right leg.  Patient is quite active and has witnessed Achilles injury, and is suspicious that he has probably injured his Achilles tendon.  Patient's pain is primarily over the Achilles area.  He has also noticed some increased swelling over time.  Patient denies pain elsewhere.  He denies any numbness or tingling.  Home Medications Prior to Admission medications   Medication Sig Start Date End Date Taking? Authorizing Provider  ibuprofen (ADVIL) 600 MG tablet Take 1 tablet (600 mg total) by mouth every 6 (six) hours as needed. 09/01/22  Yes Varney Biles, MD  oxyCODONE-acetaminophen (PERCOCET/ROXICET) 5-325 MG tablet Take 1 tablet by mouth every 8 (eight) hours as needed for severe pain. 09/01/22  Yes Adalai Perl, Thelma Comp, MD  Ferrous Sulfate (IRON PO) Take by mouth.    [provider]  Ocrelizumab (OCREVUS IV) Inject 600 mg into the vein every 6 (six) months.    [provider]  sildenafil (VIAGRA) 100 MG tablet Use 1/2 tab to 1 tab po 30-120 min. before anticipated intercourse 06/06/22   Britt Bottom, MD  VITAMIN D PO Take 4,000 Units by mouth daily.    [provider]      Allergies    Nickel and Other    Review of Systems   Review of Systems  Physical Exam Updated Vital Signs BP (!) 135/96   Pulse 74   Temp 98.6 F (37 C)   Resp 20   SpO2 97%  Physical Exam Vitals and nursing note reviewed.  Constitutional:      Appearance: He is well-developed.  HENT:     Head: Atraumatic.  Cardiovascular:     Rate and Rhythm: Normal rate.  Pulmonary:      Effort: Pulmonary effort is normal.  Musculoskeletal:     Cervical back: Neck supple.     Comments: Positive Thompson's test, there is some tenderness with palpation over the Achilles region.  I do not see any lumping of the muscle in the calf region.  Patient is having difficulty with plantar and dorsiflexion of the ankle  Skin:    General: Skin is warm.  Neurological:     Mental Status: He is alert and oriented to person, place, and time.     ED Results / Procedures / Treatments   Labs (all labs ordered are listed, but only abnormal results are displayed) Labs Reviewed - No data to display  EKG None  Radiology DG Ankle Complete Right  Result Date: 09/01/2022 CLINICAL DATA:  Leg injury. EXAM: RIGHT ANKLE - COMPLETE 3+ VIEW COMPARISON:  None Available. FINDINGS: Mild distal medial malleolar degenerative osteophytosis. Mild distal anterior greater than posterior tibial plafond degenerative osteophytosis. The ankle mortise is symmetric and intact. Minimal dorsal talonavicular degenerative osteophytosis. No acute fracture is seen. No dislocation. IMPRESSION: Mild tibiotalar osteoarthritis. No acute fracture. Electronically Signed   By: Yvonne Kendall M.D.   On: 09/01/2022 14:04    Procedures Procedures    Medications Ordered in ED Medications  oxyCODONE-acetaminophen (PERCOCET/ROXICET) 5-325 MG per  tablet 1 tablet (has no administration in time range)  naproxen (NAPROSYN) tablet 500 mg (has no administration in time range)    ED Course/ Medical Decision Making/ A&P                           Medical Decision Making Risk Prescription drug management.   42 year old male comes in with chief complaint of leg injury. Injury occurred while he was playing basketball.  On exam, he has positive Thompson's test.  Differential diagnosis considered includes avulsion fracture, ankle sprain, Achilles tendinopathy and Achilles tendon tear/rupture.  Clinically it appears that he likely has  Achilles tendon tear and not a complete rupture.  We will treat with RICE, advised follow-up with orthopedic surgery in 10 days. Nonweightbearing status recommended.  Crutches provided.  Patient will be put in a posterior leg splint.  Final Clinical Impression(s) / ED Diagnoses Final diagnoses:  Rupture of right Achilles tendon, initial encounter    Rx / DC Orders ED Discharge Orders          Ordered    ibuprofen (ADVIL) 600 MG tablet  Every 6 hours PRN        09/01/22 1527    oxyCODONE-acetaminophen (PERCOCET/ROXICET) 5-325 MG tablet  Every 8 hours PRN        09/01/22 1527              Varney Biles, MD 09/01/22 1538

## 2022-09-01 NOTE — ED Notes (Signed)
Dc instructions reviewed with patient. Patient voiced understanding. Dc with belongings.  °

## 2022-09-06 DIAGNOSIS — M25571 Pain in right ankle and joints of right foot: Secondary | ICD-10-CM | POA: Diagnosis not present

## 2022-09-07 DIAGNOSIS — Y999 Unspecified external cause status: Secondary | ICD-10-CM | POA: Diagnosis not present

## 2022-09-07 DIAGNOSIS — S86091A Other specified injury of right Achilles tendon, initial encounter: Secondary | ICD-10-CM | POA: Diagnosis not present

## 2022-09-07 DIAGNOSIS — G8918 Other acute postprocedural pain: Secondary | ICD-10-CM | POA: Diagnosis not present

## 2022-09-07 DIAGNOSIS — X58XXXA Exposure to other specified factors, initial encounter: Secondary | ICD-10-CM | POA: Diagnosis not present

## 2022-09-07 DIAGNOSIS — S86001A Unspecified injury of right Achilles tendon, initial encounter: Secondary | ICD-10-CM | POA: Diagnosis not present

## 2022-09-22 DIAGNOSIS — S86001A Unspecified injury of right Achilles tendon, initial encounter: Secondary | ICD-10-CM | POA: Diagnosis not present

## 2022-09-27 DIAGNOSIS — S86001D Unspecified injury of right Achilles tendon, subsequent encounter: Secondary | ICD-10-CM | POA: Diagnosis not present

## 2022-09-28 DIAGNOSIS — S86001A Unspecified injury of right Achilles tendon, initial encounter: Secondary | ICD-10-CM | POA: Diagnosis not present

## 2022-10-03 DIAGNOSIS — G35 Multiple sclerosis: Secondary | ICD-10-CM | POA: Diagnosis not present

## 2022-10-05 DIAGNOSIS — S86001A Unspecified injury of right Achilles tendon, initial encounter: Secondary | ICD-10-CM | POA: Diagnosis not present

## 2022-10-19 DIAGNOSIS — S86001A Unspecified injury of right Achilles tendon, initial encounter: Secondary | ICD-10-CM | POA: Diagnosis not present

## 2022-10-28 DIAGNOSIS — S86001A Unspecified injury of right Achilles tendon, initial encounter: Secondary | ICD-10-CM | POA: Diagnosis not present

## 2022-11-29 DIAGNOSIS — S86001D Unspecified injury of right Achilles tendon, subsequent encounter: Secondary | ICD-10-CM | POA: Diagnosis not present

## 2023-01-04 DIAGNOSIS — M25521 Pain in right elbow: Secondary | ICD-10-CM | POA: Diagnosis not present

## 2023-01-06 ENCOUNTER — Encounter: Payer: Self-pay | Admitting: Neurology

## 2023-01-21 DIAGNOSIS — M25521 Pain in right elbow: Secondary | ICD-10-CM | POA: Diagnosis not present

## 2023-01-26 DIAGNOSIS — G5630 Lesion of radial nerve, unspecified upper limb: Secondary | ICD-10-CM | POA: Insufficient documentation

## 2023-01-26 DIAGNOSIS — G5631 Lesion of radial nerve, right upper limb: Secondary | ICD-10-CM | POA: Diagnosis not present

## 2023-02-07 DIAGNOSIS — S86001D Unspecified injury of right Achilles tendon, subsequent encounter: Secondary | ICD-10-CM | POA: Diagnosis not present

## 2023-02-10 DIAGNOSIS — G5631 Lesion of radial nerve, right upper limb: Secondary | ICD-10-CM | POA: Diagnosis not present

## 2023-03-06 NOTE — Progress Notes (Signed)
PATIENT: Dylan Mccoy DOB: Apr 21, 1981  REASON FOR VISIT: follow up HISTORY FROM: patient  Chief Complaint  Patient presents with   Room 1    Pt is here Alone. Pt states that things have been good with his MS since last appointment. Pt states that his is staying busy and active. Pt states that his Esaw Dace has been working well with him and he doesn't have any complaints.     HISTORY OF PRESENT ILLNESS:  03/07/23 Dylan Mccoy is a 42 y.o. 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 Swtiched from [place] 09/2021 due to positive JCV. MRI stable in 12/2020. Next infusion 03/2023.   He is doing well, today. No new or exacerbating symptoms. Gait stable. No falls. No changes in bowel or bladder habits. He feels vision is normal. Mood is good. He has alternating sleep schedule. 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 He is taking vitamin D 1000iu most every day. Doesn't use Viagra often but it helps when needed.   He is s/p achilles tendon repair. He reports recovering well. He is back to playing basketball. He has an injury to right bicep currently followed by [facility]. He is planning to have NCS/EMG next week. Has trouble with bicep curls but otherwise doing ok.   HISTORY: (copied from Dr Bonnita Hollow previous note)  Dylan Mccoy is a 42 y.o. man with relapsing remitting multiple sclerosis diagnosed in June 2018   Update 08/17/2022 He is on Ocrevus (was Tysabri) due to converting to middle positive JCV Ab status.     He had his split dose February 2023 and second  dose is August 2023.   Next dose is February 2023.  He tolerated the infusions well.       MRI 08/11/2021 showed no new lesions.   Also has a focus at C4 in the spinal cord.         Gait, strength and tone are fine.  He can go up and down stairs without holding the bannister.   He is very physically active and plays basketball some and lifts weights.  He jogs some.    He denies  numbness or dysesthesias.   Bladder function is fine.   Vision is doing well.   He has some fatigue which is more of a problem in the heat.    He has swing shift so sleep is variable - he likes as he has mor etim during the day.   He feels good with 6 hours of sleep.    Mood and cognition are doing well.      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  He has two kids, 70 year old boy and an 37-year-old boy.       MS history:  On 02/10/2017, while vacationing in [place], he woke up with numbness on the right side of his face, his tongue feeling thick and having slurred speech. He also noted some mild numbness and clumsiness in the right hand. He did not note any symptoms in the leg. The left side was fine. He had no trouble with his walking. When he returned to [facility] and his symptoms persisted, he presented to the emergency room. An MRI of the brain was performed showing multiple T2/flair hyperintense lesions consistent with MS. Two foci, one larger one in the left centrum  semiovale ovale and a smaller one on the right side of the midbrain enhanced consistent with more acute lesions. While in the hospital he also had an MRI of the cervical spine that showed one non-enhancing focus adjacent to C4. He received 5 days of IV steroids. By the time he got to [facility] on 02/15/2017, he was already better compared to couple days earlier and he improved further after the steroids.      MRI of the brain and cervical spine from 02/15/2017 and 02/17/2017. The MRI of the brain shows a large enhancing lesion in the centrum semiovale ovale on the left and a small right midbrain lesion that were enhancing consistent with acute MS plaque. There were several other periventricular foci consistent with MS. The MRI of the spinal cord showed a plaque anteriorly at the C4 level.   IMAGING MRI of the brain 08/11/2021 shows T2/FLAIR hyperintense foci in the  hemispheres, predominantly in the periventricular white matter.  The pattern is consistent with chronic demyelinating plaque associated with multiple sclerosis.  None of the foci appear to be acute.  Compared to the MRI from 01/06/2021, there are no new lesions.   MRI of the brain and cervical spine 01/06/2021 shows a stable pattern of demyelinating plaques in the hemisphere unchanged compared to the 06/05/2019 MRI.  The cervical spine shows a focus adjacent to C4 anteriorly consistent with chronic demyelination.  There are mild degenerative changes that are stable and do not lead to significant spinal stenosis or foraminal narrowing.   MRI of the brain and cervical spine from 02/15/2017 and 02/17/2017. The MRI of the brain shows a large enhancing lesion in the centrum semiovale ovale on the left and a small right midbrain lesion that were enhancing consistent with acute MS plaque. There were several other periventricular foci consistent with MS. The MRI of the spinal cord showed a plaque anteriorly at the C4 level.   MRI 06/05/2019 showed multiple T2/flair hyperintense foci in the periventricular, juxtacortical and deep white matter in a pattern and configuration consistent with chronic demyelinating plaque associated with multiple sclerosis.  None of the foci appears to be acute and they do not enhance.  They were all present on the MRI dated 11/21/2017.  LAst cervical spine MRi in 2018 showed one focus at C4.      REVIEW OF SYSTEMS: Out of a complete 14 system review of symptoms, the patient complains only of the following symptoms, right bicep pain  and all other reviewed systems are negative.   ALLERGIES: Allergies  Allergen Reactions   Nickel    Other     Fragrances    HOME MEDICATIONS: Outpatient Medications Prior to Visit  Medication Sig Dispense Refill   Ferrous Sulfate (IRON PO) Take by mouth.     Ocrelizumab (OCREVUS IV) Inject 600 mg into the vein every 6 (six) months.     sildenafil  (VIAGRA) 100 MG tablet Use 1/2 tab to 1 tab po 30-120 min. before anticipated intercourse 10 tablet 8   VITAMIN D PO Take 1,000 Units by mouth daily.     ibuprofen (ADVIL) 600 MG tablet Take 1 tablet (600 mg total) by mouth every 6 (six) hours as needed. 30 tablet 0   oxyCODONE-acetaminophen (PERCOCET/ROXICET) 5-325 MG tablet Take 1 tablet by mouth every 8 (eight) hours as needed for severe pain. 6 tablet 0   No facility-administered medications prior to visit.    PAST MEDICAL HISTORY: Past Medical History:  Diagnosis Date   Multiple  sclerosis (HCC)    Seizures (HCC)     PAST SURGICAL HISTORY: Past Surgical History:  Procedure Laterality Date   FRACTURE SURGERY     R elbow, 2nd grade   KNEE SURGERY  08/2022    FAMILY HISTORY: Family History  Problem Relation Age of Onset   Asthma Mother    Allergies Mother    Urticaria Mother    Healthy Father    Asthma Brother    Allergies Brother    Diabetes Maternal Grandmother    Diabetes Paternal Grandmother    Allergic rhinitis Neg Hx    Angioedema Neg Hx    Atopy Neg Hx    Eczema Neg Hx    Immunodeficiency Neg Hx     SOCIAL HISTORY: Social History   Socioeconomic History   Marital status: Married    Spouse name: Not on file   Number of children: Not on file   Years of education: Not on file   Highest education level: Not on file  Occupational History   Not on file  Tobacco Use   Smoking status: Former   Smokeless tobacco: Never  Substance and Sexual Activity   Alcohol use: No   Drug use: No   Sexual activity: Yes    Birth control/protection: Pill    Comment: 1 partner last year  Other Topics Concern   Not on file  Social History Narrative   Left Handed   Social Determinants of Health   Financial Resource Strain: Not on file  Food Insecurity: Not on file  Transportation Needs: Not on file  Physical Activity: Not on file  Stress: Not on file  Social Connections: Not on file  Intimate Partner Violence: Not  on file      PHYSICAL EXAM  Vitals:   03/07/23 0929  BP: (!) 148/90  Pulse: 66  Weight: 240 lb (108.9 kg)  Height: 6\' 4"  (1.93 m)     Body mass index is 29.21 kg/m.  Generalized: Well developed, in no acute distress  Cardiology: normal rate and rhythm, no murmur noted Respiratory: clear to auscultation bilaterally  Neurological examination  Mentation: Alert oriented to time, place, history taking. Follows all commands speech and language fluent Cranial nerve II-XII: Pupils were equal round reactive to light. Extraocular movements were full, visual field were full on confrontational test. Facial sensation and strength were normal. Head turning and shoulder shrug  were normal and symmetric. Motor: The motor testing reveals 5 over 5 strength of all 4 extremities. Good symmetric motor tone is noted throughout.  Sensory: Sensory testing is intact to soft touch on all 4 extremities. No evidence of extinction is noted.  Coordination: Cerebellar testing reveals good finger-nose-finger and heel-to-shin bilaterally.  Gait and station: Gait is normal.  Reflexes: Deep tendon reflexes are symmetric and normal bilaterally.    DIAGNOSTIC DATA (LABS, IMAGING, TESTING) - I reviewed patient records, labs, notes, testing and imaging myself where available.      No data to display           Lab Results  Component Value Date   WBC 7.7 07/27/2021   HGB 12.8 (L) 07/27/2021   HCT 41.5 07/27/2021   MCV 75 (L) 07/27/2021   PLT 164 07/27/2021      Component Value Date/Time   NA 136 02/19/2017 0335   K 3.7 02/19/2017 0335   CL 100 (L) 02/19/2017 0335   CO2 29 02/19/2017 0335   GLUCOSE 143 (H) 02/19/2017 0335   BUN 17 02/19/2017  0335   CREATININE 1.10 02/19/2017 0335   CALCIUM 8.8 (L) 02/19/2017 0335   PROT 7.5 08/04/2021 0000   ALBUMIN 4.6 08/04/2021 0000   AST 23 08/04/2021 0000   ALT 31 08/04/2021 0000   ALKPHOS 48 08/04/2021 0000   BILITOT 0.2 08/04/2021 0000   GFRNONAA >60  02/19/2017 0335   GFRAA >60 02/19/2017 0335   Lab Results  Component Value Date   CHOL 90 02/16/2017   HDL 22 (L) 02/16/2017   LDLCALC 39 02/16/2017   TRIG 146 02/16/2017   CHOLHDL 4.1 02/16/2017   Lab Results  Component Value Date   HGBA1C 5.0 02/16/2017   No results found for: "VITAMINB12" Lab Results  Component Value Date   TSH 1.517 02/16/2017       ASSESSMENT AND PLAN 42 y.o. year old male  has a past medical history of Multiple sclerosis (HCC) and Seizures (HCC). here with     ICD-10-CM   1. Relapsing remitting multiple sclerosis (HCC)  G35 CBC with Differential/Platelets    IgG, IgA, IgM    2. High risk medication use  Z79.899     3. Vitamin D deficiency  E55.9 Vitamin D, 25-hydroxy    4. Other fatigue  R53.83     5. Other male erectile dysfunction  N52.8         Zaragoza is doing very well. We will continue Ocrevus every 6 months. We will update labs, today. He was encouraged to stay well hydrated. Focus on well balanced diet and regular exercise. He will follow up with Dr Epimenio Foot in 6 months. He verbalizes understanding and agreement with this plan.    Orders Placed This Encounter  Procedures   CBC with Differential/Platelets   IgG, IgA, IgM   Vitamin D, 25-hydroxy      No orders of the defined types were placed in this encounter.      Shawnie Dapper, FNP-C 03/07/2023, 9:50 AM [facility] [address] [place], [place] [address] [phone]

## 2023-03-06 NOTE — Patient Instructions (Signed)
Below is our plan:  We will continue Ocrevus.   Please make sure you are staying well hydrated. I recommend 50-60 ounces daily. Well balanced diet and regular exercise encouraged. Consistent sleep schedule with 6-8 hours recommended.   Please continue follow up with care team as directed.   Follow up with Dr Epimenio Foot in 6 months  You may receive a survey regarding today's visit. I encourage you to leave honest feed back as I do use this information to improve patient care. Thank you for seeing me today!

## 2023-03-07 ENCOUNTER — Ambulatory Visit (INDEPENDENT_AMBULATORY_CARE_PROVIDER_SITE_OTHER): Payer: BC Managed Care – PPO | Admitting: Family Medicine

## 2023-03-07 ENCOUNTER — Encounter: Payer: Self-pay | Admitting: Family Medicine

## 2023-03-07 VITALS — BP 148/90 | HR 66 | Ht 76.0 in | Wt 240.0 lb

## 2023-03-07 DIAGNOSIS — Z79899 Other long term (current) drug therapy: Secondary | ICD-10-CM | POA: Diagnosis not present

## 2023-03-07 DIAGNOSIS — R5383 Other fatigue: Secondary | ICD-10-CM

## 2023-03-07 DIAGNOSIS — G35 Multiple sclerosis: Secondary | ICD-10-CM

## 2023-03-07 DIAGNOSIS — N528 Other male erectile dysfunction: Secondary | ICD-10-CM

## 2023-03-07 DIAGNOSIS — E559 Vitamin D deficiency, unspecified: Secondary | ICD-10-CM

## 2023-03-08 LAB — CBC WITH DIFFERENTIAL/PLATELET
Basophils Absolute: 0 10*3/uL (ref 0.0–0.2)
Basos: 0 %
EOS (ABSOLUTE): 0.1 10*3/uL (ref 0.0–0.4)
Eos: 2 %
Hematocrit: 42.7 % (ref 37.5–51.0)
Hemoglobin: 12.9 g/dL — ABNORMAL LOW (ref 13.0–17.7)
Immature Grans (Abs): 0 10*3/uL (ref 0.0–0.1)
Immature Granulocytes: 0 %
Lymphocytes Absolute: 1.7 10*3/uL (ref 0.7–3.1)
Lymphs: 25 %
MCH: 23 pg — ABNORMAL LOW (ref 26.6–33.0)
MCHC: 30.2 g/dL — ABNORMAL LOW (ref 31.5–35.7)
MCV: 76 fL — ABNORMAL LOW (ref 79–97)
Monocytes Absolute: 0.8 10*3/uL (ref 0.1–0.9)
Monocytes: 12 %
Neutrophils Absolute: 4.1 10*3/uL (ref 1.4–7.0)
Neutrophils: 61 %
Platelets: 162 10*3/uL (ref 150–450)
RBC: 5.6 x10E6/uL (ref 4.14–5.80)
RDW: 13.3 % (ref 11.6–15.4)
WBC: 6.7 10*3/uL (ref 3.4–10.8)

## 2023-03-08 LAB — IGG, IGA, IGM
IgA/Immunoglobulin A, Serum: 344 mg/dL (ref 90–386)
IgG (Immunoglobin G), Serum: 1348 mg/dL (ref 603–1613)
IgM (Immunoglobulin M), Srm: 43 mg/dL (ref 20–172)

## 2023-03-08 LAB — VITAMIN D 25 HYDROXY (VIT D DEFICIENCY, FRACTURES): Vit D, 25-Hydroxy: 43 ng/mL (ref 30.0–100.0)

## 2023-04-03 DIAGNOSIS — G35 Multiple sclerosis: Secondary | ICD-10-CM | POA: Diagnosis not present

## 2023-04-10 ENCOUNTER — Other Ambulatory Visit: Payer: Self-pay

## 2023-04-10 MED ORDER — SILDENAFIL CITRATE 100 MG PO TABS: ORAL_TABLET | ORAL | 8 refills | Status: DC

## 2023-04-17 DIAGNOSIS — M79601 Pain in right arm: Secondary | ICD-10-CM | POA: Diagnosis not present

## 2023-09-14 ENCOUNTER — Encounter: Payer: Self-pay | Admitting: Neurology

## 2023-09-14 ENCOUNTER — Ambulatory Visit (INDEPENDENT_AMBULATORY_CARE_PROVIDER_SITE_OTHER): Payer: BC Managed Care – PPO | Admitting: Neurology

## 2023-09-14 VITALS — BP 144/106 | HR 82 | Ht 76.0 in | Wt 246.5 lb

## 2023-09-14 DIAGNOSIS — E559 Vitamin D deficiency, unspecified: Secondary | ICD-10-CM | POA: Diagnosis not present

## 2023-09-14 DIAGNOSIS — G35 Multiple sclerosis: Secondary | ICD-10-CM

## 2023-09-14 DIAGNOSIS — G35A Relapsing-remitting multiple sclerosis: Secondary | ICD-10-CM

## 2023-09-14 DIAGNOSIS — Z79899 Other long term (current) drug therapy: Secondary | ICD-10-CM

## 2023-09-14 DIAGNOSIS — R200 Anesthesia of skin: Secondary | ICD-10-CM

## 2023-09-14 DIAGNOSIS — R5383 Other fatigue: Secondary | ICD-10-CM | POA: Diagnosis not present

## 2023-09-14 NOTE — Progress Notes (Signed)
[facility]  PATIENT: Dylan Mccoy DOB: February 13, 1981  REFERRING DOCTOR OR PCP:  Referred by Dr. Amada Jupiter     SOURCE: Patient, notes from his recent hospital admission, imaging lab reports, MRI images on PACS.  _________________________________   HISTORICAL  CHIEF COMPLAINT:  Chief Complaint  Patient presents with   Room 10    Pt is here Alone. Pt states that things have been going well with his MS since his last appointment.     HISTORY OF PRESENT ILLNESS:  Dylan Mccoy is a 43 y.o. man with relapsing remitting multiple sclerosis diagnosed in June 2018  Update 09/14/2023 He is on Ocrevus (was Tysabri) due to converting to middle positive JCV Ab status.     Next infusion is February 2025.   He tolerated the infusions well.      MRI 08/11/2021 showed no new lesions.   Also has a focus at C4 in the spinal cord.        Gait, strength and tone are fine.  He tore his Achilles playing basketball but is better an is playing agstairs well but fell once going down ad now sometimes uses the bannister.    He is very physically active and plays basketball some and lifts weights.  He jogs some.    He denies numbness or dysesthesias.   Bladder function is fine.   Vision is doing well.  He has some fatigue which is more of a problem in the heat.    He has swing shift so sleep is variable - he likes this shift as he has more tim during the day.   He feels good with 6 hours of sleep.    Mood and cognition are doing well.     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  He has two kids, 37 year old boy and an 64 year-old boy.     MS history:  On 02/10/2017, while vacationing in [place], he woke up with numbness on the right side of his face, his tongue feeling thick and having slurred speech. He also noted some mild numbness and clumsiness in the right hand. He did not note any symptoms in the leg. The left side was  fine. He had no trouble with his walking. When he returned to [facility] and his symptoms persisted, he presented to the emergency room. An MRI of the brain was performed showing multiple T2/flair hyperintense lesions consistent with MS. Two foci, one larger one in the left centrum semiovale ovale and a smaller one on the right side of the midbrain enhanced consistent with more acute lesions. While in the hospital he also had an MRI of the cervical spine that showed one non-enhancing focus adjacent to C4. He received 5 days of IV steroids. By the time he got to [facility] on 02/15/2017, he was already better compared to couple days earlier and he improved further after the steroids.   MRI of the brain and cervical spine from 02/15/2017 and 02/17/2017. The MRI of the brain shows a large enhancing lesion in the centrum semiovale ovale on the left and a small right midbrain lesion that were enhancing consistent with acute MS plaque. There were several other periventricular foci consistent with MS. The MRI of the spinal cord showed a plaque anteriorly at the C4 level.  IMAGING MRI of the brain 08/11/2021 shows T2/FLAIR hyperintense foci in the hemispheres, predominantly in the  periventricular white matter.  The pattern is consistent with chronic demyelinating plaque associated with multiple sclerosis.  None of the foci appear to be acute.  Compared to the MRI from 01/06/2021, there are no new lesions.  MRI of the brain and cervical spine 01/06/2021 shows a stable pattern of demyelinating plaques in the hemisphere unchanged compared to the 06/05/2019 MRI.  The cervical spine shows a focus adjacent to C4 anteriorly consistent with chronic demyelination.  There are mild degenerative changes that are stable and do not lead to significant spinal stenosis or foraminal narrowing.  MRI of the brain and cervical spine from 02/15/2017 and 02/17/2017. The MRI of the brain shows a large enhancing lesion in the centrum  semiovale ovale on the left and a small right midbrain lesion that were enhancing consistent with acute MS plaque. There were several other periventricular foci consistent with MS. The MRI of the spinal cord showed a plaque anteriorly at the C4 level.  MRI 06/05/2019 showed multiple T2/flair hyperintense foci in the periventricular, juxtacortical and deep white matter in a pattern and configuration consistent with chronic demyelinating plaque associated with multiple sclerosis.  None of the foci appears to be acute and they do not enhance.  They were all present on the MRI dated 11/21/2017.  LAst cervical spine MRi in 2018 showed one focus at C4.      REVIEW OF SYSTEMS: Constitutional: No fevers, chills, sweats, or change in appetite Eyes: No visual changes, double vision, eye pain Ear, nose and throat: No hearing loss, ear pain, nasal congestion, sore throat Cardiovascular: No chest pain, palpitations Respiratory:  No shortness of breath at rest or with exertion.   No wheezes GastrointestinaI: No nausea, vomiting, diarrhea, abdominal pain, fecal incontinence Genitourinary:  No dysuria, urinary retention or frequency.  No nocturia. Musculoskeletal:  No neck pain, back pain Integumentary: No rash, pruritus, skin lesions Neurological: as above Psychiatric: No depression at this time.  No anxiety Endocrine: No palpitations, diaphoresis, change in appetite, change in weigh or increased thirst Hematologic/Lymphatic:  No anemia, purpura, petechiae. Allergic/Immunologic: No itchy/runny eyes, nasal congestion, recent allergic reactions, rashes  ALLERGIES: Allergies  Allergen Reactions   Nickel    Other     Fragrances    HOME MEDICATIONS:  Current Outpatient Medications:    Multiple Vitamins-Minerals (MULTIVITAMIN WITH MINERALS) tablet, Take 1 tablet by mouth daily., Disp: , Rfl:    Ocrelizumab (OCREVUS IV), Inject 600 mg into the vein every 6 (six) months., Disp: , Rfl:    sildenafil (VIAGRA)  100 MG tablet, Use 1/2 tab to 1 tab po 30-120 min. before anticipated intercourse, Disp: 10 tablet, Rfl: 8   VITAMIN D PO, Take 1,000 Units by mouth daily., Disp: , Rfl:    Ferrous Sulfate (IRON PO), Take by mouth. (Patient not taking: Reported on 09/14/2023), Disp: , Rfl:   PAST MEDICAL HISTORY: Past Medical History:  Diagnosis Date   Multiple sclerosis (HCC)    Seizures (HCC)     PAST SURGICAL HISTORY: Past Surgical History:  Procedure Laterality Date   FRACTURE SURGERY     R elbow, 2nd grade   KNEE SURGERY  08/2022    FAMILY HISTORY: Family History  Problem Relation Age of Onset   Asthma Mother    Allergies Mother    Urticaria Mother    Healthy Father    Asthma Brother    Allergies Brother    Diabetes Maternal Grandmother    Diabetes Paternal Grandmother    Allergic rhinitis Neg Hx  Angioedema Neg Hx    Atopy Neg Hx    Eczema Neg Hx    Immunodeficiency Neg Hx     SOCIAL HISTORY:  Social History   Socioeconomic History   Marital status: Married    Spouse name: Not on file   Number of children: Not on file   Years of education: Not on file   Highest education level: Not on file  Occupational History   Not on file  Tobacco Use   Smoking status: Former   Smokeless tobacco: Never  Substance and Sexual Activity   Alcohol use: No   Drug use: No   Sexual activity: Yes    Birth control/protection: Pill    Comment: 1 partner last year  Other Topics Concern   Not on file  Social History Narrative   Left Handed   Social Drivers of Health   Financial Resource Strain: Not on file  Food Insecurity: Not on file  Transportation Needs: Not on file  Physical Activity: Not on file  Stress: Not on file  Social Connections: Not on file  Intimate Partner Violence: Not on file     PHYSICAL EXAM  Vitals:   09/14/23 1251  BP: (!) 144/106  Pulse: 82  Weight: 246 lb 8 oz (111.8 kg)  Height: 6\' 4"  (1.93 m)    Body mass index is 30 kg/m.   General: The  patient is well-developed and well-nourished and in no acute distress  Neurologic Exam  Mental status: The patient is alert and oriented x 3 at the time of the examination. The patient has apparent normal recent and remote memory, with an apparently normal attention span and concentration ability.   Speech is normal in content.  Cranial nerves: Extraocular movements are full.  Color vision is symmetric.  Facial strength and sensation is normal. Trapezius strength is normal. Normal speech.   No obvious hearing deficits are noted.  Motor:  Muscle bulk is normal.   Tone is normal. Strength is  5 / 5 in all 4 extremities.   Sensory: he has intact touch, temperature and vibration sensation in the arms and legs.remities.  Coordination: Finger-nose-finger and heel-to-shin is performed well.  Gait and station: Station is normal.  The gait and tandem gait are normal.  Romberg is negative.  Reflexes: Deep tendon reflexes are symmetric and normal bilaterally in the arms.  Deep tendon reflexes were increased at the knees.  There was no ankle clonus.   DIAGNOSTIC DATA (LABS, IMAGING, TESTING) - I reviewed patient records, labs, notes, testing and imaging myself where available.  Lab Results  Component Value Date   WBC 6.7 03/07/2023   HGB 12.9 (L) 03/07/2023   HCT 42.7 03/07/2023   MCV 76 (L) 03/07/2023   PLT 162 03/07/2023      Component Value Date/Time   NA 136 02/19/2017 0335   K 3.7 02/19/2017 0335   CL 100 (L) 02/19/2017 0335   CO2 29 02/19/2017 0335   GLUCOSE 143 (H) 02/19/2017 0335   BUN 17 02/19/2017 0335   CREATININE 1.10 02/19/2017 0335   CALCIUM 8.8 (L) 02/19/2017 0335   PROT 7.5 08/04/2021 0000   ALBUMIN 4.6 08/04/2021 0000   AST 23 08/04/2021 0000   ALT 31 08/04/2021 0000   ALKPHOS 48 08/04/2021 0000   BILITOT 0.2 08/04/2021 0000   GFRNONAA >60 02/19/2017 0335   GFRAA >60 02/19/2017 0335   Lab Results  Component Value Date   CHOL 90 02/16/2017   HDL 22 (L)  02/16/2017   LDLCALC 39 02/16/2017   TRIG 146 02/16/2017   CHOLHDL 4.1 02/16/2017   Lab Results  Component Value Date   HGBA1C 5.0 02/16/2017   No results found for: "VITAMINB12" Lab Results  Component Value Date   TSH 1.517 02/16/2017       ASSESSMENT AND PLAN  Relapsing remitting multiple sclerosis (HCC)  High risk medication use  Vitamin D deficiency  Other fatigue  Numbness   1.   Continue Ocrevus.  Check IgG/IgM today.Marland Kitchen   MRI of the brain check to determine if there is any breakthrough activity.  If positive, consider changing to a different disease modifying therapy 2.   Continue to be active.   Continue Vit D 5000 units daily.    3.    Return in 6 months or sooner if there are new or worsening neurologic symptoms.   Keniya Schlotterbeck A. Epimenio Foot, MD, [facility] 09/14/2023, 1:28 PM Certified in Neurology, Clinical Neurophysiology, Sleep Medicine, Pain Medicine and Neuroimaging  [facility] [address], [address] [place], [place] [address] [phone]

## 2023-09-15 LAB — IGG, IGA, IGM
IgA/Immunoglobulin A, Serum: 329 mg/dL (ref 90–386)
IgG (Immunoglobin G), Serum: 1359 mg/dL (ref 603–1613)
IgM (Immunoglobulin M), Srm: 40 mg/dL (ref 20–172)

## 2023-09-19 ENCOUNTER — Ambulatory Visit: Payer: BC Managed Care – PPO

## 2023-09-19 DIAGNOSIS — G35 Multiple sclerosis: Secondary | ICD-10-CM | POA: Diagnosis not present

## 2023-09-19 MED ORDER — GADOBENATE DIMEGLUMINE 529 MG/ML IV SOLN
20.0000 mL | Freq: Once | INTRAVENOUS | Status: AC | PRN
  Administered 2023-09-19: 20 mL via INTRAVENOUS

## 2023-09-20 ENCOUNTER — Other Ambulatory Visit: Payer: Self-pay | Admitting: Neurology

## 2023-09-20 ENCOUNTER — Encounter: Payer: Self-pay | Admitting: Neurology

## 2023-09-20 MED ORDER — AZITHROMYCIN 250 MG PO TABS: ORAL_TABLET | ORAL | 0 refills | Status: DC

## 2023-10-11 ENCOUNTER — Encounter: Payer: Self-pay | Admitting: Neurology

## 2023-11-02 DIAGNOSIS — G35 Multiple sclerosis: Secondary | ICD-10-CM | POA: Diagnosis not present

## 2024-02-09 ENCOUNTER — Other Ambulatory Visit: Payer: Self-pay | Admitting: Neurology

## 2024-03-12 NOTE — Progress Notes (Unsigned)
 PATIENT: Dylan Mccoy DOB: 07-12-1981  REASON FOR VISIT: follow up HISTORY FROM: patient  No chief complaint on file.   HISTORY OF PRESENT ILLNESS:  03/12/24 ALL: Dylan Mccoy is a 43 y.o. 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 Swtiched from [facility]ys[facility]ri 09/2021 due to positive JCV. MRI stable in 12/2020. Next infusion 03/2023.   He is doing well, today. No new or exacerbating symptoms. Gait stable. No falls. No changes in bowel or bladder habits. He feels vision is normal. Mood is good. He has alternating sleep schedule. 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 He is taking vitamin D  1000iu most every day. Doesn't use Viagra  often but it helps when needed.   He is s/p achilles tendon repair. He reports recovering well. He is back to playing basketball. He has an injury to right bicep currently followed by [facility]. He is planning to have NCS/EMG next week. Has trouble with bicep curls but otherwise doing ok.   HISTORY: (copied from Dr Duncan previous note)  Dylan Mccoy is a 43 y.o. 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     Next infusion is February 2025.   He tolerated the infusions well.       MRI 08/11/2021 showed no new lesions.   Also has a focus at C4 in the spinal cord.         Gait, strength and tone are fine.  He tore his Achilles playing basketball but is better an is playing agstairs well but fell once going down ad now sometimes uses the bannister.    He is very physically active and plays basketball some and lifts weights.  He jogs some.    He denies numbness or dysesthesias.   Bladder function is fine.   Vision is doing well.   He has some fatigue which is more of a problem in the heat.    He has swing shift so sleep is variable - he likes this shift as he has more tim during the day.    He feels good with 6 hours of sleep.    Mood and cognition are doing well.      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  He has two kids, 31 year old boy and an 14 year-old boy.      MS history:  On 02/10/2017, while vacationing in [place] , he woke up with numbness on the right side of his face, his tongue feeling thick and having slurred speech. He also noted some mild numbness and clumsiness in the right hand. He did not note any symptoms in the leg. The left side was fine. He had no trouble with his walking. When he returned to [facility] and his symptoms persisted, he presented to the emergency room. An MRI of the brain was performed showing multiple T2/flair hyperintense lesions consistent with MS. Two foci, one larger one in the left centrum semiovale ovale and a smaller one on the right side of the midbrain enhanced consistent with more acute lesions. While in the hospital he also had an MRI of the cervical spine that showed one non-enhancing focus adjacent to C4. He received 5 days of IV steroids. By the time he  got to [facility] on 02/15/2017, he was already better compared to couple days earlier and he improved further after the steroids.    MRI of the brain and cervical spine from 02/15/2017 and 02/17/2017. The MRI of the brain shows a large enhancing lesion in the centrum semiovale ovale on the left and a small right midbrain lesion that were enhancing consistent with acute MS plaque. There were several other periventricular foci consistent with MS. The MRI of the spinal cord showed a plaque anteriorly at the C4 level.   IMAGING MRI of the brain 08/11/2021 shows T2/FLAIR hyperintense foci in the hemispheres, predominantly in the periventricular white matter.  The pattern is consistent with chronic demyelinating plaque associated with multiple sclerosis.  None of the foci appear to be acute.  Compared to the MRI from 01/06/2021, there  are no new lesions.   MRI of the brain and cervical spine 01/06/2021 shows a stable pattern of demyelinating plaques in the hemisphere unchanged compared to the 06/05/2019 MRI.  The cervical spine shows a focus adjacent to C4 anteriorly consistent with chronic demyelination.  There are mild degenerative changes that are stable and do not lead to significant spinal stenosis or foraminal narrowing.   MRI of the brain and cervical spine from 02/15/2017 and 02/17/2017. The MRI of the brain shows a large enhancing lesion in the centrum semiovale ovale on the left and a small right midbrain lesion that were enhancing consistent with acute MS plaque. There were several other periventricular foci consistent with MS. The MRI of the spinal cord showed a plaque anteriorly at the C4 level.   MRI 06/05/2019 showed multiple T2/flair hyperintense foci in the periventricular, juxtacortical and deep white matter in a pattern and configuration consistent with chronic demyelinating plaque associated with multiple sclerosis.  None of the foci appears to be acute and they do not enhance.  They were all present on the MRI dated 11/21/2017.  LAst cervical spine MRi in 2018 showed one focus at C4.    REVIEW OF SYSTEMS: Out of a complete 14 system review of symptoms, the patient complains only of the following symptoms, right bicep pain  and all other reviewed systems are negative.   ALLERGIES: Allergies  Allergen Reactions   Nickel    Other     Fragrances    HOME MEDICATIONS: Outpatient Medications Prior to Visit  Medication Sig Dispense Refill   azithromycin  (ZITHROMAX  Z-PAK) 250 MG tablet Take 2 po on the first day and then one po every day the next 4 days 6 each 0   Ferrous Sulfate (IRON PO) Take by mouth. (Patient not taking: Reported on 09/14/2023)     Multiple Vitamins-Minerals (MULTIVITAMIN WITH MINERALS) tablet Take 1 tablet by mouth daily.     Ocrelizumab (OCREVUS IV) Inject 600 mg into the vein every 6 (six)  months.     sildenafil  (VIAGRA ) 100 MG tablet USE ONE-HALF TO ONE TABLET BY MOUTH 30-120 MINUTES. BEFORE ANTICIPATED INTERCOURSE. 10 tablet 0   VITAMIN D  PO Take 1,000 Units by mouth daily.     No facility-administered medications prior to visit.    PAST MEDICAL HISTORY: Past Medical History:  Diagnosis Date   Multiple sclerosis (HCC)    Seizures (HCC)     PAST SURGICAL HISTORY: Past Surgical History:  Procedure Laterality Date   FRACTURE SURGERY     R elbow, 2nd grade   KNEE SURGERY  08/2022    FAMILY HISTORY: Family History  Problem Relation Age of Onset  Asthma Mother    Allergies Mother    Urticaria Mother    Healthy Father    Asthma Brother    Allergies Brother    Diabetes Maternal Grandmother    Diabetes Paternal Grandmother    Allergic rhinitis Neg Hx    Angioedema Neg Hx    Atopy Neg Hx    Eczema Neg Hx    Immunodeficiency Neg Hx     SOCIAL HISTORY: Social History   Socioeconomic History   Marital status: Married    Spouse name: Not on file   Number of children: Not on file   Years of education: Not on file   Highest education level: Not on file  Occupational History   Not on file  Tobacco Use   Smoking status: Former   Smokeless tobacco: Never  Substance and Sexual Activity   Alcohol use: No   Drug use: No   Sexual activity: Yes    Birth control/protection: Pill    Comment: 1 partner last year  Other Topics Concern   Not on file  Social History Narrative   Left Handed   Social Drivers of Health   Financial Resource Strain: Not on file  Food Insecurity: Not on file  Transportation Needs: Not on file  Physical Activity: Not on file  Stress: Not on file  Social Connections: Not on file  Intimate Partner Violence: Not on file      PHYSICAL EXAM  There were no vitals filed for this visit.    There is no height or weight on file to calculate BMI.  Generalized: Well developed, in no acute distress  Cardiology: normal rate and  rhythm, no murmur noted Respiratory: clear to auscultation bilaterally  Neurological examination  Mentation: Alert oriented to time, place, history taking. Follows all commands speech and language fluent Cranial nerve II-XII: Pupils were equal round reactive to light. Extraocular movements were full, visual field were full on confrontational test. Facial sensation and strength were normal. Head turning and shoulder shrug  were normal and symmetric. Motor: The motor testing reveals 5 over 5 strength of all 4 extremities. Good symmetric motor tone is noted throughout.  Sensory: Sensory testing is intact to soft touch on all 4 extremities. No evidence of extinction is noted.  Coordination: Cerebellar testing reveals good finger-nose-finger and heel-to-shin bilaterally.  Gait and station: Gait is normal.  Reflexes: Deep tendon reflexes are symmetric and normal bilaterally.    DIAGNOSTIC DATA (LABS, IMAGING, TESTING) - I reviewed patient records, labs, notes, testing and imaging myself where available.      No data to display           Lab Results  Component Value Date   WBC 6.7 03/07/2023   HGB 12.9 (L) 03/07/2023   HCT 42.7 03/07/2023   MCV 76 (L) 03/07/2023   PLT 162 03/07/2023      Component Value Date/Time   NA 136 02/19/2017 0335   K 3.7 02/19/2017 0335   CL 100 (L) 02/19/2017 0335   CO2 29 02/19/2017 0335   GLUCOSE 143 (H) 02/19/2017 0335   BUN 17 02/19/2017 0335   CREATININE 1.10 02/19/2017 0335   CALCIUM 8.8 (L) 02/19/2017 0335   PROT 7.5 08/04/2021 0000   ALBUMIN 4.6 08/04/2021 0000   AST 23 08/04/2021 0000   ALT 31 08/04/2021 0000   ALKPHOS 48 08/04/2021 0000   BILITOT 0.2 08/04/2021 0000   GFRNONAA >60 02/19/2017 0335   GFRAA >60 02/19/2017 0335   Lab Results  Component Value Date   CHOL 90 02/16/2017   HDL 22 (L) 02/16/2017   LDLCALC 39 02/16/2017   TRIG 146 02/16/2017   CHOLHDL 4.1 02/16/2017   Lab Results  Component Value Date   HGBA1C 5.0  02/16/2017   No results found for: [id number] Lab Results  Component Value Date   TSH 1.517 02/16/2017       ASSESSMENT AND PLAN 43 y.o. year old male  has a past medical history of Multiple sclerosis (HCC) and Seizures (HCC). here with   No diagnosis found.     Nicolas is doing very well. We will continue Ocrevus every 6 months. We will update labs, today. He was encouraged to stay well hydrated. Focus on well balanced diet and regular exercise. He will follow up with Dr Vear in 6 months. He verbalizes understanding and agreement with this plan.    No orders of the defined types were placed in this encounter.     No orders of the defined types were placed in this encounter.      Greig Forbes, FNP-C 03/12/2024, 12:42 PM [facility] [address] [place], [place] [address] [phone]

## 2024-03-12 NOTE — Patient Instructions (Signed)

## 2024-03-13 ENCOUNTER — Encounter: Payer: Self-pay | Admitting: Family Medicine

## 2024-03-13 ENCOUNTER — Ambulatory Visit (INDEPENDENT_AMBULATORY_CARE_PROVIDER_SITE_OTHER): Payer: BC Managed Care – PPO | Admitting: Family Medicine

## 2024-03-13 VITALS — BP 136/72 | HR 64 | Ht 76.0 in | Wt 252.2 lb

## 2024-03-13 DIAGNOSIS — Z79899 Other long term (current) drug therapy: Secondary | ICD-10-CM

## 2024-03-13 DIAGNOSIS — N528 Other male erectile dysfunction: Secondary | ICD-10-CM

## 2024-03-13 DIAGNOSIS — E559 Vitamin D deficiency, unspecified: Secondary | ICD-10-CM

## 2024-03-13 DIAGNOSIS — R5383 Other fatigue: Secondary | ICD-10-CM | POA: Diagnosis not present

## 2024-03-13 DIAGNOSIS — G35 Multiple sclerosis: Secondary | ICD-10-CM | POA: Diagnosis not present

## 2024-03-14 ENCOUNTER — Ambulatory Visit: Payer: Self-pay | Admitting: Family Medicine

## 2024-03-14 LAB — CBC WITH DIFFERENTIAL/PLATELET
Basophils Absolute: 0 x10E3/uL (ref 0.0–0.2)
Basos: 1 %
EOS (ABSOLUTE): 0.1 x10E3/uL (ref 0.0–0.4)
Eos: 2 %
Hematocrit: 44.1 % (ref 37.5–51.0)
Hemoglobin: 13.2 g/dL (ref 13.0–17.7)
Immature Grans (Abs): 0 x10E3/uL (ref 0.0–0.1)
Immature Granulocytes: 0 %
Lymphocytes Absolute: 1.8 x10E3/uL (ref 0.7–3.1)
Lymphs: 40 %
MCH: 23.8 pg — ABNORMAL LOW (ref 26.6–33.0)
MCHC: 29.9 g/dL — ABNORMAL LOW (ref 31.5–35.7)
MCV: 80 fL (ref 79–97)
Monocytes Absolute: 0.6 x10E3/uL (ref 0.1–0.9)
Monocytes: 14 %
Neutrophils Absolute: 1.9 x10E3/uL (ref 1.4–7.0)
Neutrophils: 43 %
Platelets: 182 x10E3/uL (ref 150–450)
RBC: 5.54 x10E6/uL (ref 4.14–5.80)
RDW: 13 % (ref 11.6–15.4)
WBC: 4.5 x10E3/uL (ref 3.4–10.8)

## 2024-03-14 LAB — IGG, IGA, IGM
IgA/Immunoglobulin A, Serum: 321 mg/dL (ref 90–386)
IgG (Immunoglobin G), Serum: 1274 mg/dL (ref 603–1613)
IgM (Immunoglobulin M), Srm: 39 mg/dL (ref 20–172)

## 2024-03-20 ENCOUNTER — Encounter: Payer: Self-pay | Admitting: Neurology

## 2024-04-01 DIAGNOSIS — M25522 Pain in left elbow: Secondary | ICD-10-CM | POA: Diagnosis not present

## 2024-04-02 DIAGNOSIS — M5416 Radiculopathy, lumbar region: Secondary | ICD-10-CM | POA: Diagnosis not present

## 2024-04-06 ENCOUNTER — Other Ambulatory Visit: Payer: Self-pay | Admitting: Neurology

## 2024-04-10 DIAGNOSIS — M5416 Radiculopathy, lumbar region: Secondary | ICD-10-CM | POA: Diagnosis not present

## 2024-04-23 DIAGNOSIS — M5416 Radiculopathy, lumbar region: Secondary | ICD-10-CM | POA: Diagnosis not present

## 2024-04-30 DIAGNOSIS — M5416 Radiculopathy, lumbar region: Secondary | ICD-10-CM | POA: Diagnosis not present

## 2024-05-02 DIAGNOSIS — G35 Multiple sclerosis: Secondary | ICD-10-CM | POA: Diagnosis not present

## 2024-05-05 ENCOUNTER — Encounter: Payer: Self-pay | Admitting: Neurology

## 2024-05-08 ENCOUNTER — Encounter: Payer: Self-pay | Admitting: Family Medicine

## 2024-05-08 ENCOUNTER — Ambulatory Visit: Admitting: Family Medicine

## 2024-05-08 VITALS — BP 126/82 | HR 64 | Ht 76.0 in | Wt 243.0 lb

## 2024-05-08 DIAGNOSIS — Z125 Encounter for screening for malignant neoplasm of prostate: Secondary | ICD-10-CM

## 2024-05-08 DIAGNOSIS — Z13220 Encounter for screening for lipoid disorders: Secondary | ICD-10-CM

## 2024-05-08 DIAGNOSIS — Z136 Encounter for screening for cardiovascular disorders: Secondary | ICD-10-CM

## 2024-05-08 DIAGNOSIS — R030 Elevated blood-pressure reading, without diagnosis of hypertension: Secondary | ICD-10-CM

## 2024-05-08 DIAGNOSIS — Z131 Encounter for screening for diabetes mellitus: Secondary | ICD-10-CM | POA: Diagnosis not present

## 2024-05-08 DIAGNOSIS — R5383 Other fatigue: Secondary | ICD-10-CM

## 2024-05-08 DIAGNOSIS — G35 Multiple sclerosis: Secondary | ICD-10-CM

## 2024-05-08 NOTE — Progress Notes (Signed)
 New Patient Office Visit  Subjective    Patient ID: Dylan Mccoy, male    DOB: 05-30-1981  Age: 43 y.o. MRN: [id number]  CC:  Chief Complaint  Patient presents with   Establish Care   Hypertension    Has had high bp readings      Assessment & Plan:   Elevated blood pressure reading -     Comprehensive metabolic panel with GFR  Encounter for lipid screening for cardiovascular disease -     Lipid panel  Diabetes mellitus screening -     Hemoglobin A1c  Screening for prostate cancer -     PSA  Other fatigue -     TSH  Multiple sclerosis (HCC)   Assessment and Plan Assessment & Plan Elevated blood pressure readings Reports elevated blood pressure over six months with family history of hypertension. Home readings show systolic 130s-150s, diastolic 19-09. Office reading today 126/82 mmHg, improved from previous. Increased physical activity may have contributed to improvement. Not on antihypertensives but aware of family history and concerned. - Monitor blood pressure at home with proper technique. - Order blood work to check kidney function. - Follow up in one month with recorded blood pressure readings. - Educated on lifestyle modifications to manage blood pressure. - Discussed potential for false readings due to improper cuff size or position. - Advised to contact via MyChart or schedule a tele-visit if concerns arise before next follow-up.  Multiple sclerosis Diagnosed after episode of right-sided weakness and speech difficulties. MRI confirmed multiple sclerosis with two new spinal lesions. Currently on Ocrevus, controlling symptoms. Active lifestyle maintained, no workdays missed, considered at baseline. - Continue Ocrevus infusions every six months. - Continue regular follow-up appointments with neurologist every three to four months.    Return in about 4 weeks (around 06/05/2024) for Annual / ELevated BP.SABRA   Vinary K Ellene Bloodsaw, MD   HPI   Dylan Mccoy  presents to establish care Discussed the use of AI scribe software for clinical note transcription with the patient, who gave verbal consent to proceed.  History of Present Illness Dylan Mccoy is a 43 year old male with multiple sclerosis who presents with concerns about elevated blood pressure.  Six months ago, during a routine infusion for multiple sclerosis, his blood pressure was elevated. Since then, home monitoring shows systolic readings from the upper 130s to lower 150s and diastolic readings between 80 to 90. Today's manual reading is 126/82. He has a family history of hypertension, including grandparents, parents, and siblings.  He is not on antihypertensive medication. He was recently prescribed Bactrim for a bacterial infection but has not started it. He takes an iron supplement daily and stopped multivitamins and a hair, skin, and nail supplement on April 09, 2024, due to concerns about kidney damage.  He remains active, playing basketball and lifting weights, despite previous injuries to his Achilles and brachialis. He attributes initial high blood pressure to inactivity following these injuries. His multiple sclerosis is managed with Ocrevus infusions, and he experiences fatigue but no pain or vision changes.    Outpatient Encounter Medications as of 05/08/2024  Medication Sig   Ferrous Sulfate (IRON PO) Take by mouth.   Ocrelizumab (OCREVUS IV) Inject 600 mg into the vein every 6 (six) months.   sildenafil  (VIAGRA ) 100 MG tablet TAKE 1/2 TO 1 (ONE-HALF TO ONE) TABLET BY MOUTH 30 TO 120 MINUTES BEFORE ANTICIPATED INTERCOURSE   [DISCONTINUED] meloxicam (MOBIC) 15 MG tablet Take 15 mg by  mouth daily.   sulfamethoxazole-trimethoprim (BACTRIM DS) 800-160 MG tablet Take 1 tablet by mouth 2 (two) times daily. (Patient not taking: Reported on 05/08/2024)   [DISCONTINUED] Multiple Vitamins-Minerals (MULTIVITAMIN WITH MINERALS) tablet Take 1 tablet by mouth daily.   [DISCONTINUED]  VITAMIN D  PO Take 1,000 Units by mouth daily.   No facility-administered encounter medications on file as of 05/08/2024.      Review of Systems  All other systems reviewed and are negative.       Objective    BP 126/82   Pulse 64   Ht 6' 4 (1.93 m)   Wt 243 lb (110.2 kg)   SpO2 98%   BMI 29.58 kg/m   Physical Exam Vitals and nursing note reviewed.  Constitutional:      Appearance: Normal appearance.  HENT:     Head: Normocephalic.     Right Ear: External ear normal.     Left Ear: External ear normal.  Eyes:     Conjunctiva/sclera: Conjunctivae normal.  Cardiovascular:     Rate and Rhythm: Normal rate.  Pulmonary:     Effort: Pulmonary effort is normal. No respiratory distress.  Abdominal:     Palpations: Abdomen is soft.  Musculoskeletal:        General: Normal range of motion.  Skin:    General: Skin is warm.  Neurological:     Mental Status: He is alert and oriented to person, place, and time.  Psychiatric:        Mood and Affect: Mood normal.

## 2024-05-14 DIAGNOSIS — M5416 Radiculopathy, lumbar region: Secondary | ICD-10-CM | POA: Diagnosis not present

## 2024-05-18 ENCOUNTER — Other Ambulatory Visit: Payer: Self-pay | Admitting: Neurology

## 2024-05-20 NOTE — Telephone Encounter (Signed)
 Last filled by patient 04/08/24 last visit was 03/13/24 next office visit is sheduled on 10/16/24

## 2024-05-22 DIAGNOSIS — M51379 Other intervertebral disc degeneration, lumbosacral region without mention of lumbar back pain or lower extremity pain: Secondary | ICD-10-CM | POA: Diagnosis not present

## 2024-05-22 DIAGNOSIS — M48061 Spinal stenosis, lumbar region without neurogenic claudication: Secondary | ICD-10-CM | POA: Diagnosis not present

## 2024-05-22 DIAGNOSIS — M47816 Spondylosis without myelopathy or radiculopathy, lumbar region: Secondary | ICD-10-CM | POA: Diagnosis not present

## 2024-05-22 DIAGNOSIS — M5416 Radiculopathy, lumbar region: Secondary | ICD-10-CM | POA: Diagnosis not present

## 2024-05-22 DIAGNOSIS — M5126 Other intervertebral disc displacement, lumbar region: Secondary | ICD-10-CM | POA: Diagnosis not present

## 2024-05-28 DIAGNOSIS — M5416 Radiculopathy, lumbar region: Secondary | ICD-10-CM | POA: Diagnosis not present

## 2024-06-05 DIAGNOSIS — Z136 Encounter for screening for cardiovascular disorders: Secondary | ICD-10-CM | POA: Diagnosis not present

## 2024-06-05 DIAGNOSIS — R5383 Other fatigue: Secondary | ICD-10-CM | POA: Diagnosis not present

## 2024-06-05 DIAGNOSIS — Z13220 Encounter for screening for lipoid disorders: Secondary | ICD-10-CM | POA: Diagnosis not present

## 2024-06-05 DIAGNOSIS — Z125 Encounter for screening for malignant neoplasm of prostate: Secondary | ICD-10-CM | POA: Diagnosis not present

## 2024-06-05 DIAGNOSIS — Z131 Encounter for screening for diabetes mellitus: Secondary | ICD-10-CM | POA: Diagnosis not present

## 2024-06-06 ENCOUNTER — Encounter: Payer: Self-pay | Admitting: Family Medicine

## 2024-06-06 ENCOUNTER — Ambulatory Visit (INDEPENDENT_AMBULATORY_CARE_PROVIDER_SITE_OTHER): Admitting: Family Medicine

## 2024-06-06 ENCOUNTER — Ambulatory Visit: Payer: Self-pay | Admitting: Family Medicine

## 2024-06-06 VITALS — BP 124/86 | HR 57 | Ht 76.0 in | Wt 246.0 lb

## 2024-06-06 DIAGNOSIS — Z0000 Encounter for general adult medical examination without abnormal findings: Secondary | ICD-10-CM

## 2024-06-06 DIAGNOSIS — R030 Elevated blood-pressure reading, without diagnosis of hypertension: Secondary | ICD-10-CM

## 2024-06-06 LAB — COMPREHENSIVE METABOLIC PANEL WITH GFR
ALT: 55 IU/L — ABNORMAL HIGH (ref 0–44)
AST: 37 IU/L (ref 0–40)
Albumin: 4.8 g/dL (ref 4.1–5.1)
Alkaline Phosphatase: 56 IU/L (ref 47–123)
BUN/Creatinine Ratio: 8 — ABNORMAL LOW (ref 9–20)
BUN: 10 mg/dL (ref 6–24)
Bilirubin Total: 0.7 mg/dL (ref 0.0–1.2)
CO2: 23 mmol/L (ref 20–29)
Calcium: 9.4 mg/dL (ref 8.7–10.2)
Chloride: 101 mmol/L (ref 96–106)
Creatinine, Ser: 1.19 mg/dL (ref 0.76–1.27)
Globulin, Total: 2.4 g/dL (ref 1.5–4.5)
Glucose: 89 mg/dL (ref 70–99)
Potassium: 4.3 mmol/L (ref 3.5–5.2)
Sodium: 140 mmol/L (ref 134–144)
Total Protein: 7.2 g/dL (ref 6.0–8.5)
eGFR: 78 mL/min/1.73 (ref >59)

## 2024-06-06 LAB — LIPID PANEL
Chol/HDL Ratio: 4.5 ratio (ref 0.0–5.0)
Cholesterol, Total: 136 mg/dL (ref 100–199)
HDL: 30 mg/dL — ABNORMAL LOW (ref >39)
LDL Chol Calc (NIH): 88 mg/dL (ref 0–99)
Triglycerides: 95 mg/dL (ref 0–149)
VLDL Cholesterol Cal: 18 mg/dL (ref 5–40)

## 2024-06-06 LAB — PSA: Prostate Specific Ag, Serum: 0.7 ng/mL (ref 0.0–4.0)

## 2024-06-06 LAB — HEMOGLOBIN A1C
Est. average glucose Bld gHb Est-mCnc: 100 mg/dL
Hgb A1c MFr Bld: 5.1 % (ref 4.8–5.6)

## 2024-06-06 LAB — TSH: TSH: 1.46 u[IU]/mL (ref 0.450–4.500)

## 2024-06-06 NOTE — Progress Notes (Signed)
 Complete physical exam  Patient: Dylan Mccoy   DOB: 1981/07/29   43 y.o. Male  MRN: [id number]  Subjective:    Chief Complaint  Patient presents with   Annual Exam    Dylan Mccoy is a 42 y.o. male who presents today for a complete physical exam. He reports consuming a general diet. Gym/ health club routine includes cardio, mod to heavy weightlifting, and treadmill. He generally feels fairly well. He reports sleeping fairly well. He does not have additional problems to discuss today.   BP Readings from Last 3 Encounters:  06/06/24 124/86  05/08/24 126/82  03/13/24 136/72   Home BP readings < 120/80 range. Most recent fall risk assessment:    06/06/2024    9:28 AM  Fall Risk   Falls in the past year? 0  Number falls in past yr: 0  Injury with Fall? 0  Risk for fall due to : No Fall Risks  Follow up Falls evaluation completed     Most recent depression screenings:    06/06/2024    9:28 AM 05/08/2024    9:32 AM  PHQ 2/9 Scores  PHQ - 2 Score 0 0    Vision:Within last year     Patient Care Team: Mattson Dayal K, MD as PCP - General (Family Medicine)   Outpatient Medications Prior to Visit  Medication Sig   Ferrous Sulfate (IRON PO) Take by mouth.   Ocrelizumab (OCREVUS IV) Inject 600 mg into the vein every 6 (six) months.   sildenafil  (VIAGRA ) 100 MG tablet TAKE 1/2 TO 1 (ONE-HALF TO ONE) TABLET BY MOUTH ONCE DAILY 30 MINUTES TO  TWO  HOURS  BEFORE  ANTICIPATED  INTERCOURSE   sulfamethoxazole-trimethoprim (BACTRIM DS) 800-160 MG tablet Take 1 tablet by mouth 2 (two) times daily. (Patient not taking: Reported on 06/06/2024)   No facility-administered medications prior to visit.    Review of Systems  All other systems reviewed and are negative.         Objective:     BP 124/86   Pulse (!) 57   Ht 6' 4 (1.93 m)   Wt 246 lb (111.6 kg)   SpO2 98%   BMI 29.94 kg/m     Physical Exam Vitals and nursing note reviewed.  Constitutional:       Appearance: Normal appearance.  HENT:     Head: Normocephalic.     Right Ear: External ear normal.     Left Ear: External ear normal.  Eyes:     Conjunctiva/sclera: Conjunctivae normal.  Cardiovascular:     Rate and Rhythm: Normal rate.  Pulmonary:     Effort: Pulmonary effort is normal. No respiratory distress.  Abdominal:     Palpations: Abdomen is soft.  Musculoskeletal:        General: Normal range of motion.  Skin:    General: Skin is warm.  Neurological:     Mental Status: He is alert and oriented to person, place, and time.  Psychiatric:        Mood and Affect: Mood normal.      No results found for any visits on 06/06/24. Last lipids Lab Results  Component Value Date   CHOL 136 06/05/2024   HDL 30 (L) 06/05/2024   LDLCALC 88 06/05/2024   TRIG 95 06/05/2024   CHOLHDL 4.5 06/05/2024   Last hemoglobin A1c Lab Results  Component Value Date   HGBA1C 5.1 06/05/2024        Assessment &  Plan:    Routine Health Maintenance and Physical Exam   There is no immunization history on file for this patient.  Health Maintenance  Topic Date Due   DTaP/Tdap/Td (1 - Tdap) Never done   Hepatitis B Vaccines 19-59 Average Risk (1 of 3 - 19+ 3-dose series) Never done   HPV VACCINES (1 - 3-dose SCDM series) Never done   Influenza Vaccine  11/26/2024 (Originally 03/29/2024)   COVID-19 Vaccine (1) 06/22/2025 (Originally 04/25/1986)   Hepatitis C Screening  Completed   HIV Screening  Completed   Pneumococcal Vaccine  Aged Out   Meningococcal B Vaccine  Aged Out    Discussed health benefits of physical activity, and encouraged him to engage in regular exercise appropriate for his age and condition.  Problem List Items Addressed This Visit   None Visit Diagnoses       Annual physical exam    -  Primary     Elevated blood pressure reading          Return in about 1 year (around 06/06/2025), or Annual.     Ladoris MARLA Ny, MD

## 2024-07-18 ENCOUNTER — Other Ambulatory Visit: Payer: Self-pay | Admitting: Neurology

## 2024-10-16 ENCOUNTER — Ambulatory Visit: Admitting: Neurology

## 2025-06-09 ENCOUNTER — Encounter: Admitting: Family Medicine
